# Patient Record
Sex: Male | Born: 1960
Health system: Southern US, Community
[De-identification: ages and names within clinical notes are randomized; demographics above are authoritative.]

## PROBLEM LIST (undated history)

## (undated) DIAGNOSIS — M1990 Unspecified osteoarthritis, unspecified site: Secondary | ICD-10-CM

## (undated) HISTORY — PX: UMBILICAL HERNIA REPAIR: SHX196

---

## 1998-12-02 ENCOUNTER — Encounter: Payer: Self-pay | Admitting: Emergency Medicine

## 1998-12-02 ENCOUNTER — Emergency Department (HOSPITAL_COMMUNITY): Admission: EM | Admit: 1998-12-02 | Discharge: 1998-12-02 | Payer: Self-pay | Admitting: Emergency Medicine

## 2001-07-16 ENCOUNTER — Encounter: Payer: Self-pay | Admitting: Gastroenterology

## 2001-07-16 ENCOUNTER — Encounter: Admission: RE | Admit: 2001-07-16 | Discharge: 2001-07-16 | Payer: Self-pay | Admitting: Gastroenterology

## 2002-08-11 ENCOUNTER — Ambulatory Visit (HOSPITAL_BASED_OUTPATIENT_CLINIC_OR_DEPARTMENT_OTHER): Admission: RE | Admit: 2002-08-11 | Discharge: 2002-08-11 | Payer: Self-pay | Admitting: General Surgery

## 2002-08-11 ENCOUNTER — Encounter (INDEPENDENT_AMBULATORY_CARE_PROVIDER_SITE_OTHER): Payer: Self-pay | Admitting: Specialist

## 2006-11-05 ENCOUNTER — Encounter: Admission: RE | Admit: 2006-11-05 | Discharge: 2006-11-05 | Payer: Self-pay | Admitting: Family Medicine

## 2010-06-03 NOTE — Op Note (Signed)
NAME:  Hector Phillips, Hector Phillips                         ACCOUNT NO.:  [id number]   MEDICAL RECORD NO.:  [id number]                   PATIENT TYPE:  AMB   LOCATION:  DSC                                  FACILITY:  [facility]   PHYSICIAN:  Adolph Pollack, M.D.            DATE OF BIRTH:  December 11, 1960   DATE OF PROCEDURE:  08/11/2002  DATE OF DISCHARGE:                                 OPERATIVE REPORT   PREOPERATIVE DIAGNOSIS:  Soft tissue mass of the right occipital scalp area.   POSTOPERATIVE DIAGNOSIS:  Soft tissue mass of the right occipital scalp  area.   OPERATION/PROCEDURE:  Excision of soft tissue mass from right occipital  scalp region.   ANESTHESIA:  Local, (1% plain lidocaine plus 0.5% plain Marcaine).   INDICATIONS FOR PROCEDURE:  [name] is a 50 year old male who has noticed  a small lump in the back of his scalp and neck area that has been enlarging  here recently.  He denies infection or trauma.  He is now for excision.   TECHNIQUE:  He was brought to the minor procedure room and placed prone on  the table.  The soft tissue mass was marked with a marking pen and the area  was sterilely prepped and draped.  Local anesthetic was infiltrated directly  over the area.  A transverse incision was made directly over the mass and  carried through the subcutaneous tissue and fatty tissue which was fairly  thick.  The mass initially started superficial but grew deep.  There was  some small nerves and blood vessels that I noted that had to be dissected  free from the mass and some of the blood vessels had to be ligated and cut.  I was then able to use blunt dissection to remove the mass which was  lipomatous in nature and measured approximately 2 cm.  The nerve was left  intact.   I examined the wound and at that time, hemostasis appeared adequate.  I  loosely closed some of the subcutaneous tissue with 4-0 Vicryl interrupted  sutures and closed the skin with a running 4-0 Monocryl  subcuticular stitch.  Steri-Strips and sterile dressings were applied.   He tolerated the procedure well without any apparent complications.  He was  sent home in satisfactory condition.  We will see him here in about 10 days  for wound check.  I did tell him that if he had any excessive bleeding or  signs of infection, to call us.  I also recommended that he try Tylenol or  Advil for pain and if that was not sufficient, to call us before 5 o'clock  and we could call him in something a little stronger.  Adolph Pollack, M.D.    Kari Baars  D:  08/11/2002  T:  08/11/2002  Job:  [id number]

## 2013-05-06 ENCOUNTER — Other Ambulatory Visit: Payer: Self-pay | Admitting: Family Medicine

## 2013-05-06 ENCOUNTER — Ambulatory Visit
Admission: RE | Admit: 2013-05-06 | Discharge: 2013-05-06 | Disposition: A | Discharge status: Home / Self Care | Payer: Federal, State, Local not specified - PPO | Source: Ambulatory Visit | Attending: Family Medicine | Admitting: Family Medicine

## 2013-05-06 DIAGNOSIS — M25559 Pain in unspecified hip: Secondary | ICD-10-CM

## 2013-05-06 DIAGNOSIS — M25552 Pain in left hip: Secondary | ICD-10-CM

## 2013-05-23 ENCOUNTER — Ambulatory Visit: Payer: Federal, State, Local not specified - PPO | Attending: Family Medicine | Admitting: Physical Therapy

## 2013-05-23 DIAGNOSIS — M25559 Pain in unspecified hip: Secondary | ICD-10-CM | POA: Insufficient documentation

## 2013-05-23 DIAGNOSIS — IMO0001 Reserved for inherently not codable concepts without codable children: Secondary | ICD-10-CM | POA: Insufficient documentation

## 2013-05-27 ENCOUNTER — Ambulatory Visit: Payer: Federal, State, Local not specified - PPO | Admitting: Physical Therapy

## 2013-05-28 ENCOUNTER — Ambulatory Visit: Payer: Federal, State, Local not specified - PPO | Admitting: Physical Therapy

## 2013-06-02 ENCOUNTER — Ambulatory Visit: Payer: Federal, State, Local not specified - PPO | Admitting: Physical Therapy

## 2013-06-04 ENCOUNTER — Ambulatory Visit: Payer: Federal, State, Local not specified - PPO | Admitting: Physical Therapy

## 2013-06-06 ENCOUNTER — Ambulatory Visit: Payer: Federal, State, Local not specified - PPO | Admitting: Physical Therapy

## 2013-06-10 ENCOUNTER — Ambulatory Visit: Payer: Federal, State, Local not specified - PPO | Admitting: Physical Therapy

## 2013-06-11 ENCOUNTER — Ambulatory Visit: Payer: Federal, State, Local not specified - PPO | Admitting: Physical Therapy

## 2013-06-13 ENCOUNTER — Ambulatory Visit: Payer: Federal, State, Local not specified - PPO | Admitting: Physical Therapy

## 2013-06-16 ENCOUNTER — Ambulatory Visit: Payer: Federal, State, Local not specified - PPO | Attending: Family Medicine | Admitting: Physical Therapy

## 2013-06-16 DIAGNOSIS — IMO0001 Reserved for inherently not codable concepts without codable children: Secondary | ICD-10-CM | POA: Insufficient documentation

## 2013-06-16 DIAGNOSIS — M25559 Pain in unspecified hip: Secondary | ICD-10-CM | POA: Insufficient documentation

## 2013-06-18 ENCOUNTER — Ambulatory Visit: Payer: Federal, State, Local not specified - PPO | Admitting: Physical Therapy

## 2013-06-20 ENCOUNTER — Ambulatory Visit: Payer: Federal, State, Local not specified - PPO | Admitting: Physical Therapy

## 2013-06-23 ENCOUNTER — Encounter: Payer: Federal, State, Local not specified - PPO | Admitting: Physical Therapy

## 2013-06-25 ENCOUNTER — Encounter: Payer: Federal, State, Local not specified - PPO | Admitting: Physical Therapy

## 2013-06-27 ENCOUNTER — Encounter: Payer: Federal, State, Local not specified - PPO | Admitting: Physical Therapy

## 2013-06-30 ENCOUNTER — Ambulatory Visit: Payer: Federal, State, Local not specified - PPO | Admitting: Physical Therapy

## 2013-07-02 ENCOUNTER — Encounter: Payer: Federal, State, Local not specified - PPO | Admitting: Physical Therapy

## 2013-07-04 ENCOUNTER — Encounter: Payer: Federal, State, Local not specified - PPO | Admitting: Physical Therapy

## 2015-01-12 DIAGNOSIS — J329 Chronic sinusitis, unspecified: Secondary | ICD-10-CM

## 2015-01-12 HISTORY — DX: Chronic sinusitis, unspecified: J32.9

## 2015-01-29 DIAGNOSIS — J309 Allergic rhinitis, unspecified: Secondary | ICD-10-CM

## 2015-01-29 HISTORY — DX: Allergic rhinitis, unspecified: J30.9

## 2015-04-22 DIAGNOSIS — M25561 Pain in right knee: Secondary | ICD-10-CM | POA: Diagnosis not present

## 2015-04-26 DIAGNOSIS — M1711 Unilateral primary osteoarthritis, right knee: Secondary | ICD-10-CM | POA: Diagnosis not present

## 2015-05-03 DIAGNOSIS — K080 Exfoliation of teeth due to systemic causes: Secondary | ICD-10-CM | POA: Diagnosis not present

## 2015-09-01 DIAGNOSIS — L299 Pruritus, unspecified: Secondary | ICD-10-CM | POA: Diagnosis not present

## 2015-11-04 DIAGNOSIS — K080 Exfoliation of teeth due to systemic causes: Secondary | ICD-10-CM | POA: Diagnosis not present

## 2015-12-07 DIAGNOSIS — M67911 Unspecified disorder of synovium and tendon, right shoulder: Secondary | ICD-10-CM | POA: Diagnosis not present

## 2016-01-05 ENCOUNTER — Ambulatory Visit (INDEPENDENT_AMBULATORY_CARE_PROVIDER_SITE_OTHER): Payer: Federal, State, Local not specified - PPO | Admitting: Orthopedic Surgery

## 2016-01-05 ENCOUNTER — Encounter (INDEPENDENT_AMBULATORY_CARE_PROVIDER_SITE_OTHER): Payer: Self-pay | Admitting: Orthopedic Surgery

## 2016-01-05 ENCOUNTER — Encounter (INDEPENDENT_AMBULATORY_CARE_PROVIDER_SITE_OTHER): Payer: Self-pay

## 2016-01-05 ENCOUNTER — Ambulatory Visit (INDEPENDENT_AMBULATORY_CARE_PROVIDER_SITE_OTHER): Payer: Self-pay

## 2016-01-05 DIAGNOSIS — M25511 Pain in right shoulder: Secondary | ICD-10-CM | POA: Diagnosis not present

## 2016-01-05 DIAGNOSIS — G8929 Other chronic pain: Secondary | ICD-10-CM | POA: Diagnosis not present

## 2016-01-05 NOTE — Progress Notes (Signed)
Office Visit Note   Patient: Hector Phillips           Date of Birth: 05/10/1960           MRN: [id number] Visit Date: 01/05/2016 Requested by: Mila PalmerSharon Wolters, MD [address] [place], [place] [address] PCP: Emeterio ReeveWOLTERS,SHARON A, MD  Subjective: Chief Complaint  Patient presents with  . Right Shoulder - Pain    HPI Hector FallenKenneth is a 55 year old For the [employer] he describes several month history of right shoulder pain.  He actually injured it years ago and has had recurrent severe pain when he threw a football the summer.  He's been having a lot of superior anterior pain since that time.  Denies any neck pain or numbness and tingling radiating into the arms.  He does describe decreased strength in the right arm.  No definite mechanical symptoms.  He did try a course of an anti-inflammatory and some home exercises which did not help.  He currently not taking medicines because the previous anti-inflammatories do not help him.  When he does fast motions such is 40 flexion or extension it is.  Painful for him.  He has a constant grade 1-2 out of 10 pain in the shoulder.  He is not a smoker.  Plans on retiring from the [employer] in 7 years.  t              Review of Systems All systems reviewed are negative as they relate to the chief complaint within the history of present illness.  Patient denies  fevers or chills.    Assessment & Plan: Visit Diagnoses:  1. Chronic right shoulder pain     Plan: Impression is right shoulder pain unclear etiology.  On exam he has pretty reasonable strength and not much course grinding so if he has a rotator cuff tear is likely small.  The other possibility is that this represents a bursitis or acromioclavicular arthritis (this is not particular symptomatic on exam).  This could also represent a superior labral pathology.  Referred pain from the neck less likely.  Plan is MRI arthrogram of the right shoulder.  I'll see him back after that  study  Follow-Up Instructions: No Follow-up on file.   Orders:  Orders Placed This Encounter  Procedures  . XR Shoulder Right   No orders of the defined types were placed in this encounter.     Procedures: No procedures performed   Clinical Data: No additional findings.  Objective: Vital Signs: There were no vitals taken for this visit.  Physical Exam All systems reviewed are negative as they relate to the chief complaint within the history of present illness.  Patient denies  fevers or chills.   Ortho Exam examination demonstrates good cervical spine range of motion 5 out of 5 grip EPL FPL interosseous wrist flexion and wrist extension biceps triceps and deltoid strength.  No definite paresthesias C5 T1.  Radial pulses intact bilaterally.  Both shoulders have good range of motion passively with no restriction of external rotation at 15 of abduction.  Rotator cuff strength is good to isolated infraspinatus supraspinous subscap muscle testing.  Impingement signs positive on the right negative on the left.  O'Brien's testing negative on the right these testing positive on the right there is no real asymmetric acromioclavicular joint tenderness on exam  Specialty Comments:  No specialty comments available.  Imaging: Xr Shoulder Right  Result Date: 01/05/2016 AP outlet and axillary view right  shoulder reviewed.  Joint is reduced.  There is no evidence of glenohumeral arthritis.  There is spurring and hypertrophy of the distal end of the clavicle.  No other soft tissue calcifications present.  Type II acromion present.  Visualized lung fields are clear.    PMFS History: There are no active problems to display for this patient.  No past medical history on file.  Family History  Problem Relation Age of Onset  . Colon cancer Other     No past surgical history on file. Social History   Occupational History  . Not on file.   Social History Main Topics  . Smoking status:  Never Smoker  . Smokeless tobacco: Never Used  . Alcohol use No  . Drug use: Unknown  . Sexual activity: Not on file

## 2016-01-19 ENCOUNTER — Ambulatory Visit
Admission: RE | Admit: 2016-01-19 | Discharge: 2016-01-19 | Disposition: A | Discharge status: Home / Self Care | Payer: Federal, State, Local not specified - PPO | Source: Ambulatory Visit | Attending: Orthopedic Surgery | Admitting: Orthopedic Surgery

## 2016-01-19 DIAGNOSIS — M25511 Pain in right shoulder: Secondary | ICD-10-CM | POA: Diagnosis not present

## 2016-01-19 DIAGNOSIS — M7581 Other shoulder lesions, right shoulder: Secondary | ICD-10-CM | POA: Diagnosis not present

## 2016-01-19 DIAGNOSIS — G8929 Other chronic pain: Secondary | ICD-10-CM

## 2016-01-19 MED ORDER — IOPAMIDOL (ISOVUE-M 200) INJECTION 41%
15.0000 mL | Freq: Once | INTRAMUSCULAR | Status: AC
  Administered 2016-01-19: 15 mL via INTRA_ARTICULAR

## 2016-02-02 ENCOUNTER — Ambulatory Visit (INDEPENDENT_AMBULATORY_CARE_PROVIDER_SITE_OTHER): Payer: Federal, State, Local not specified - PPO | Admitting: Orthopedic Surgery

## 2016-02-10 ENCOUNTER — Encounter (INDEPENDENT_AMBULATORY_CARE_PROVIDER_SITE_OTHER): Payer: Self-pay | Admitting: Orthopedic Surgery

## 2016-02-10 ENCOUNTER — Encounter (INDEPENDENT_AMBULATORY_CARE_PROVIDER_SITE_OTHER): Payer: Self-pay

## 2016-02-10 ENCOUNTER — Ambulatory Visit (INDEPENDENT_AMBULATORY_CARE_PROVIDER_SITE_OTHER): Payer: Federal, State, Local not specified - PPO | Admitting: Orthopedic Surgery

## 2016-02-10 DIAGNOSIS — M75111 Incomplete rotator cuff tear or rupture of right shoulder, not specified as traumatic: Secondary | ICD-10-CM

## 2016-02-10 NOTE — Progress Notes (Signed)
   Office Visit Note   Patient: Hector Phillips           Date of Birth: 04/23/1960           MRN: [id number] Visit Date: 02/10/2016 Requested by: Mila PalmerSharon Wolters, MD [address] [place], [place] [address] PCP: Emeterio ReeveWOLTERS,SHARON A, MD  Subjective: Chief Complaint  Patient presents with  . Right Shoulder - Pain, Follow-up    HPI Hector FallenKenneth is a patient with right shoulder pain.  Since of [place] she's had an MRI scan which is reviewed today.  They show very small supraspinatus tear which does appear full-thickness.  Minimal to no retraction.  Labrum looks reasonable and before meals joint has only minimal degenerative changes.   He still taking any medication for the problem.  He has a lot of [employer] training things to do in the spring and summer.  Reaching behind him is what is most painful for him.  He's not really localizing much his pain to the acromioclavicular joint              Review of Systems All systems reviewed are negative as they relate to the chief complaint within the history of present illness.  Patient denies  fevers or chills.    Assessment & Plan: Visit Diagnoses:  1. Partial nontraumatic rupture of right rotator cuff     Plan: Impression is right shoulder small rotator cuff tear which does explain his symptoms pretty nicely.  Exam today is pretty unremarkable except for some pain with extension.  Plan is rotator cuff tear repair.  Some of this training may give him some somatic issues but I doubt that it would make the tear bigger.  We will see him back in July and likely schedule him for surgery in September at that time  Follow-Up Instructions: No Follow-up on file.   Orders:  No orders of the defined types were placed in this encounter.  No orders of the defined types were placed in this encounter.     Procedures: No procedures performed   Clinical Data: No additional findings.  Objective: Vital Signs: There were no vitals taken for this  visit.  Physical Exam   Constitutional: Patient appears well-developed HEENT:  Head: Normocephalic Eyes:EOM are normal Neck: Normal range of motion Cardiovascular: Normal rate Pulmonary/chest: Effort normal Neurologic: Patient is alert Skin: Skin is warm Psychiatric: Patient has normal mood and affect    Ortho Exam examination of the right shoulder demonstrates full active and passive range of motion with pretty reasonable strength.X-rays subscap muscle testing.  No discrete before meals joint tenderness to direct palpation right versus left nor is there significant increase in pain with crossarm adduction.  Negative apprehension relocation testing.  Specialty Comments:  No specialty comments available.  Imaging: No results found.   PMFS History: Patient Active Problem List   Diagnosis Date Noted  . Partial nontraumatic rupture of right rotator cuff 02/10/2016   No past medical history on file.  Family History  Problem Relation Age of Onset  . Colon cancer Other     No past surgical history on file. Social History   Occupational History  . Not on file.   Social History Main Topics  . Smoking status: Never Smoker  . Smokeless tobacco: Never Used  . Alcohol use No  . Drug use: Unknown  . Sexual activity: Not on file

## 2016-05-08 DIAGNOSIS — K080 Exfoliation of teeth due to systemic causes: Secondary | ICD-10-CM | POA: Diagnosis not present

## 2016-08-09 ENCOUNTER — Ambulatory Visit (INDEPENDENT_AMBULATORY_CARE_PROVIDER_SITE_OTHER): Payer: Federal, State, Local not specified - PPO | Admitting: Orthopedic Surgery

## 2016-09-22 DIAGNOSIS — K429 Umbilical hernia without obstruction or gangrene: Secondary | ICD-10-CM | POA: Diagnosis not present

## 2016-10-02 ENCOUNTER — Ambulatory Visit (INDEPENDENT_AMBULATORY_CARE_PROVIDER_SITE_OTHER): Payer: Federal, State, Local not specified - PPO | Admitting: Orthopedic Surgery

## 2016-10-02 ENCOUNTER — Encounter (INDEPENDENT_AMBULATORY_CARE_PROVIDER_SITE_OTHER): Payer: Self-pay | Admitting: Orthopedic Surgery

## 2016-10-02 DIAGNOSIS — M75111 Incomplete rotator cuff tear or rupture of right shoulder, not specified as traumatic: Secondary | ICD-10-CM

## 2016-10-05 NOTE — Progress Notes (Signed)
   Office Visit Note   Patient: Hector Phillips           Date of Birth: May 15, 1960           MRN: [id number] Visit Date: 10/02/2016 Requested by: Mila Palmer, MD [address] [place], [place] [address] PCP: Mila Palmer, MD  Subjective: Chief Complaint  Patient presents with  . Right Shoulder - Follow-up    HPI: Hector Phillips is a patient who is in the [employer].  He has right shoulder pain.  He has a nontraumatic small rotator cuff tear.  He was supposed to follow-up in July to be scheduled for surgery but he has been doing well.  He states that his shoulder and arm is getting better.  He is not reporting any mechanical symptoms in the shoulder at this time              ROS: All systems reviewed are negative as they relate to the chief complaint within the history of present illness.  Patient denies  fevers or chills.   Assessment & Plan: Visit Diagnoses:  1. Partial nontraumatic rupture of right rotator cuff     Plan: Impression is right shoulder small nontraumatic rupture of the rotator cuff.  He is doing well currently.  No real limitations.  Range of motion has normalized.  Plan is to wait this out six-month return clinical recheck at that time.  Follow-Up Instructions: Return in about 6 months (around 04/01/2017).   Orders:  No orders of the defined types were placed in this encounter.  No orders of the defined types were placed in this encounter.     Procedures: No procedures performed   Clinical Data: No additional findings.  Objective: Vital Signs: There were no vitals taken for this visit.  Physical Exam:   Constitutional: Patient appears well-developed HEENT:  Head: Normocephalic Eyes:EOM are normal Neck: Normal range of motion Cardiovascular: Normal rate Pulmonary/chest: Effort normal Neurologic: Patient is alert Skin: Skin is warm Psychiatric: Patient has normal mood and affect    Ortho Exam: Orthopedic exam demonstrates full  active and passive range of motion of the right and left shoulder.  I will detect any coarseness or grinding with internal/external rotation of the right hand shoulder.  Motor sensory function to the hand is intact bilaterally cervical spine range of motion is normal.  On the right there is negative apprehension relocation testing and no improvement clavicular joint tenderness  Specialty Comments:  No specialty comments available.  Imaging: No results found.   PMFS History: Patient Active Problem List   Diagnosis Date Noted  . Partial nontraumatic rupture of right rotator cuff 02/10/2016   No past medical history on file.  Family History  Problem Relation Age of Onset  . Colon cancer Other     No past surgical history on file. Social History   Occupational History  . Not on file.   Social History Main Topics  . Smoking status: Never Smoker  . Smokeless tobacco: Never Used  . Alcohol use No  . Drug use: Unknown  . Sexual activity: Not on file

## 2016-10-31 DIAGNOSIS — K080 Exfoliation of teeth due to systemic causes: Secondary | ICD-10-CM | POA: Diagnosis not present

## 2016-10-31 DIAGNOSIS — K439 Ventral hernia without obstruction or gangrene: Secondary | ICD-10-CM | POA: Diagnosis not present

## 2016-11-24 DIAGNOSIS — K429 Umbilical hernia without obstruction or gangrene: Secondary | ICD-10-CM | POA: Diagnosis not present

## 2016-11-24 DIAGNOSIS — K439 Ventral hernia without obstruction or gangrene: Secondary | ICD-10-CM | POA: Diagnosis not present

## 2017-02-12 DIAGNOSIS — R05 Cough: Secondary | ICD-10-CM | POA: Diagnosis not present

## 2017-03-08 DIAGNOSIS — Z0000 Encounter for general adult medical examination without abnormal findings: Secondary | ICD-10-CM | POA: Diagnosis not present

## 2017-04-11 ENCOUNTER — Ambulatory Visit (INDEPENDENT_AMBULATORY_CARE_PROVIDER_SITE_OTHER): Payer: Federal, State, Local not specified - PPO

## 2017-04-11 ENCOUNTER — Ambulatory Visit (INDEPENDENT_AMBULATORY_CARE_PROVIDER_SITE_OTHER): Payer: Federal, State, Local not specified - PPO | Admitting: Orthopedic Surgery

## 2017-04-11 ENCOUNTER — Encounter (INDEPENDENT_AMBULATORY_CARE_PROVIDER_SITE_OTHER): Payer: Self-pay | Admitting: Orthopedic Surgery

## 2017-04-11 DIAGNOSIS — G8929 Other chronic pain: Secondary | ICD-10-CM

## 2017-04-11 DIAGNOSIS — M25561 Pain in right knee: Secondary | ICD-10-CM

## 2017-04-11 NOTE — Progress Notes (Signed)
Office Visit Note   Patient: Hector Phillips           Date of Birth: 07/24/1960           MRN: [id number] Visit Date: 04/11/2017 Requested by: Mila PalmerWolters, Sharon, MD [address] [place], [place] [address] PCP: Mila PalmerWolters, Sharon, MD  Subjective: Chief Complaint  Patient presents with  . Right Shoulder - Pain, Follow-up  . Right Knee - Pain    HPI: Hector FallenKenneth is a patient who presents for reevaluation of his right shoulder.  Had small partial-thickness rotator cuff tearing evaluated 6 months ago.  He is actually taken some time off and done very well with the right shoulder.  Currently he can do push-ups and has no real shoulder issues.  He does report some significant right knee pain.  Both knees hurt him when he runs.  Has a history of patellar dislocation x1 on both knees.  Does not report any mechanical symptoms but does report pain going up and down stairs as well as with prolonged running.  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              ROS: All systems reviewed are negative as they relate to the chief complaint within the history of present illness.  Patient denies  fevers or chills.   Assessment & Plan: Visit Diagnoses:  1. Chronic pain of right knee     Plan: Impression is patellofemoral arthritis and wear in both knees right worse than left.  Plan is to avoid running which is a very provocative activity for him.  I filled out a [employer] form that states he should not really run and should do walking for his aerobic exercise check off.  His shoulder is doing pretty well.  No real restrictions regarding the shoulder.  I will see him back as needed.  Follow-Up Instructions: No follow-ups on file.   Orders:  Orders Placed This Encounter  Procedures  . XR KNEE 3 VIEW RIGHT   No orders of the defined types were placed in this encounter.     Procedures: No procedures performed   Clinical Data: No additional findings.  Objective: Vital  Signs: There were no vitals taken for this visit.  Physical Exam:   Constitutional: Patient appears well-developed HEENT:  Head: Normocephalic Eyes:EOM are normal Neck: Normal range of motion Cardiovascular: Normal rate Pulmonary/chest: Effort normal Neurologic: Patient is alert Skin: Skin is warm Psychiatric: Patient has normal mood and affect    Ortho Exam: Orthopedic exam demonstrates full active and passive range of motion of both shoulders with good rotator cuff strength to infraspinatus supraspinatus and subscap testing on the right left-hand side.  No masses lymph adenopathy or skin changes noted in the shoulder girdle region.  Motor sensory function to the hand is intact.  Both knees are examined.  Patellofemoral crepitus is present.  Negative patellar apprehension bilaterally with no effusion.  Collateral cruciate ligaments are stable.  No other masses lymph adenopathy or skin changes noted in the bilateral knee region.  Range of motion is full.  Specialty Comments:  No specialty comments available.  Imaging: No results found.   PMFS History: Patient Active Problem List   Diagnosis Date Noted  . Partial nontraumatic rupture of right rotator cuff 02/10/2016   History reviewed. No pertinent past medical history.  Family History  Problem Relation Age of Onset  . Colon cancer Other     History reviewed. No  pertinent surgical history. Social History   Occupational History  . Not on file  Tobacco Use  . Smoking status: Never Smoker  . Smokeless tobacco: Never Used  Substance and Sexual Activity  . Alcohol use: No  . Drug use: Not on file  . Sexual activity: Not on file

## 2017-04-16 ENCOUNTER — Telehealth (INDEPENDENT_AMBULATORY_CARE_PROVIDER_SITE_OTHER): Payer: Self-pay | Admitting: Orthopedic Surgery

## 2017-04-16 NOTE — Telephone Encounter (Signed)
Patient [facility] Dr[facility] August Sauce[facility]an [facility] which he wanted to send a copy to show you what this said so I will put it in Dr. Alfonso Patteneans box. He was informed that he needs a note with the letterhead from the office. In this letter it needs to explain his diagnosis and what his limitations are and if they are permanent. Dr. Alfonso Patteneans signature also needs to be on it. Please email patient once complete at   [email or web address]   If questions patient cb # [phone]

## 2017-04-17 NOTE — Telephone Encounter (Signed)
Can discuss with Dr August Saucerean when he returns to clinic tomorrow.

## 2017-04-18 NOTE — Telephone Encounter (Signed)
Patient called to check on status of letter that he needs.

## 2017-04-18 NOTE — Telephone Encounter (Signed)
I spoke with patient and advised per Dr August Saucerean

## 2017-04-18 NOTE — Telephone Encounter (Signed)
Please review and advise.

## 2017-04-18 NOTE — Telephone Encounter (Signed)
Tried calling patient. No answer. LMVM for him advising that Dr August Saucerean was in surgery all day yesterday and as soon as he gets a chance to look at this I will let him know.

## 2017-04-18 NOTE — Telephone Encounter (Signed)
I signed the form and you can send a note but in terms of accessory dictation I think everything that is required is in the clinic note

## 2017-05-02 DIAGNOSIS — Z125 Encounter for screening for malignant neoplasm of prostate: Secondary | ICD-10-CM | POA: Diagnosis not present

## 2017-05-02 DIAGNOSIS — K080 Exfoliation of teeth due to systemic causes: Secondary | ICD-10-CM | POA: Diagnosis not present

## 2017-05-02 DIAGNOSIS — K219 Gastro-esophageal reflux disease without esophagitis: Secondary | ICD-10-CM | POA: Diagnosis not present

## 2017-05-14 DIAGNOSIS — I10 Essential (primary) hypertension: Secondary | ICD-10-CM

## 2017-05-14 HISTORY — DX: Essential (primary) hypertension: I10

## 2017-05-15 ENCOUNTER — Telehealth (INDEPENDENT_AMBULATORY_CARE_PROVIDER_SITE_OTHER): Payer: Self-pay | Admitting: Orthopedic Surgery

## 2017-05-15 NOTE — Telephone Encounter (Signed)
Thanks

## 2017-05-15 NOTE — Telephone Encounter (Signed)
Patient needs copy of Xrays on a CD. He would like to pickup on May 14th. Callback [phone]

## 2017-05-15 NOTE — Telephone Encounter (Signed)
I have put images on disc that were done at our facility only. There are other exams that patient has had done previously at other facilities, and he will reach out to them for those images.

## 2017-05-29 DIAGNOSIS — I10 Essential (primary) hypertension: Secondary | ICD-10-CM | POA: Diagnosis not present

## 2017-06-01 ENCOUNTER — Telehealth (INDEPENDENT_AMBULATORY_CARE_PROVIDER_SITE_OTHER): Payer: Self-pay | Admitting: Orthopedic Surgery

## 2017-06-01 NOTE — Telephone Encounter (Signed)
I called and he is trying to bring them up on his personal home computer, I did advise that it may require that he have a medical program on the computer to be able to view the disc.  I did print out hard copies of the xrays for him. He states that his son Hector Phillips would be able to pick those up on Tuesday or Wednesday for him. Hector Phillips is not on the DPR form, I di advise that we would do a 1 time verbal auth for him to be able to pick those up.  He agreed.

## 2017-06-01 NOTE — Telephone Encounter (Signed)
Please call.

## 2017-06-01 NOTE — Telephone Encounter (Signed)
Patient called stating that the CD with his xrays on it isn't working when it's been played. If you could give him a call back at [phone]

## 2017-06-07 ENCOUNTER — Telehealth (INDEPENDENT_AMBULATORY_CARE_PROVIDER_SITE_OTHER): Payer: Self-pay | Admitting: Orthopedic Surgery

## 2017-06-07 NOTE — Telephone Encounter (Signed)
Patient LMVM wanting to pickup xray reports tomorrow morning. He will sign release form when he comes to pickup

## 2017-10-02 DIAGNOSIS — K080 Exfoliation of teeth due to systemic causes: Secondary | ICD-10-CM | POA: Diagnosis not present

## 2017-11-17 IMAGING — XA DG FLUORO GUIDE NDL PLC/BX
2 series · 2 of 2 positions shown · IV contrast (multihance)
Comparison: none

CLINICAL DATA: Chronic right shoulder pain.

EXAM:
RIGHT SHOULDER INJECTION UNDER FLUOROSCOPY
TECHNIQUE: An appropriate skin entrance site was determined. The site was
marked, prepped with Betadine, draped in the usual sterile fashion,
and infiltrated locally with buffered Lidocaine. 22 gauge spinal
needle was advanced to the superomedial margin of the humeral head
under intermittent fluoroscopy. 1 ml of Lidocaine injected easily. A
mixture of 0.05 mL of MultiHance, 5 mL of 1% lidocaine, and 15 mL of
Isovue-M 200 was then used to opacify the right shoulder capsule. 13
mL of this mixture were injected. No immediate complication.
FLUOROSCOPY TIME:  Fluoroscopy Time:  3 seconds
Radiation Exposure Index (if provided by the fluoroscopic device):
14.26 microGray*m^2
Number of Acquired Spot Images: 0

[Series 1: ortho standard · 1 of 1 slices shown (1 of 2)]
[im 1/1]
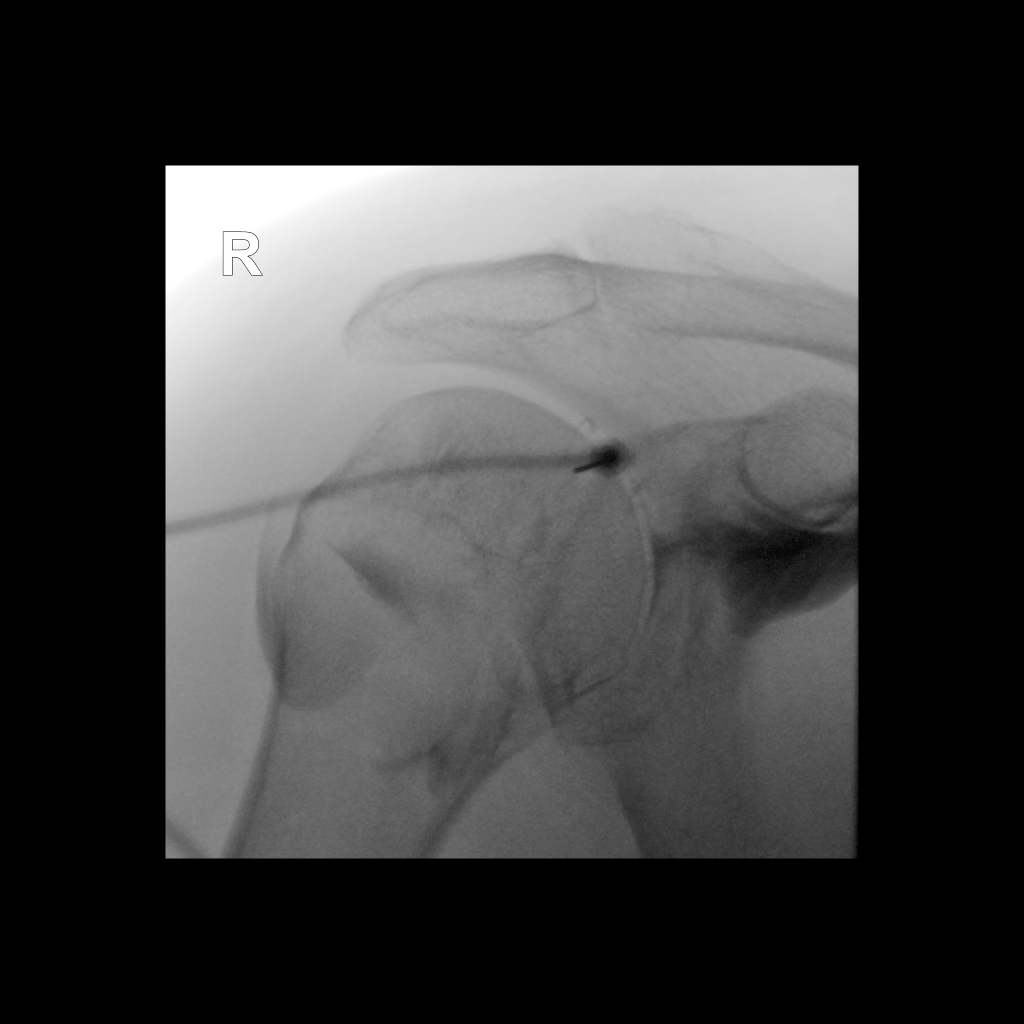

[Series 2: ortho standard · 1 of 1 slices shown (2 of 2)]
[im 1/1]
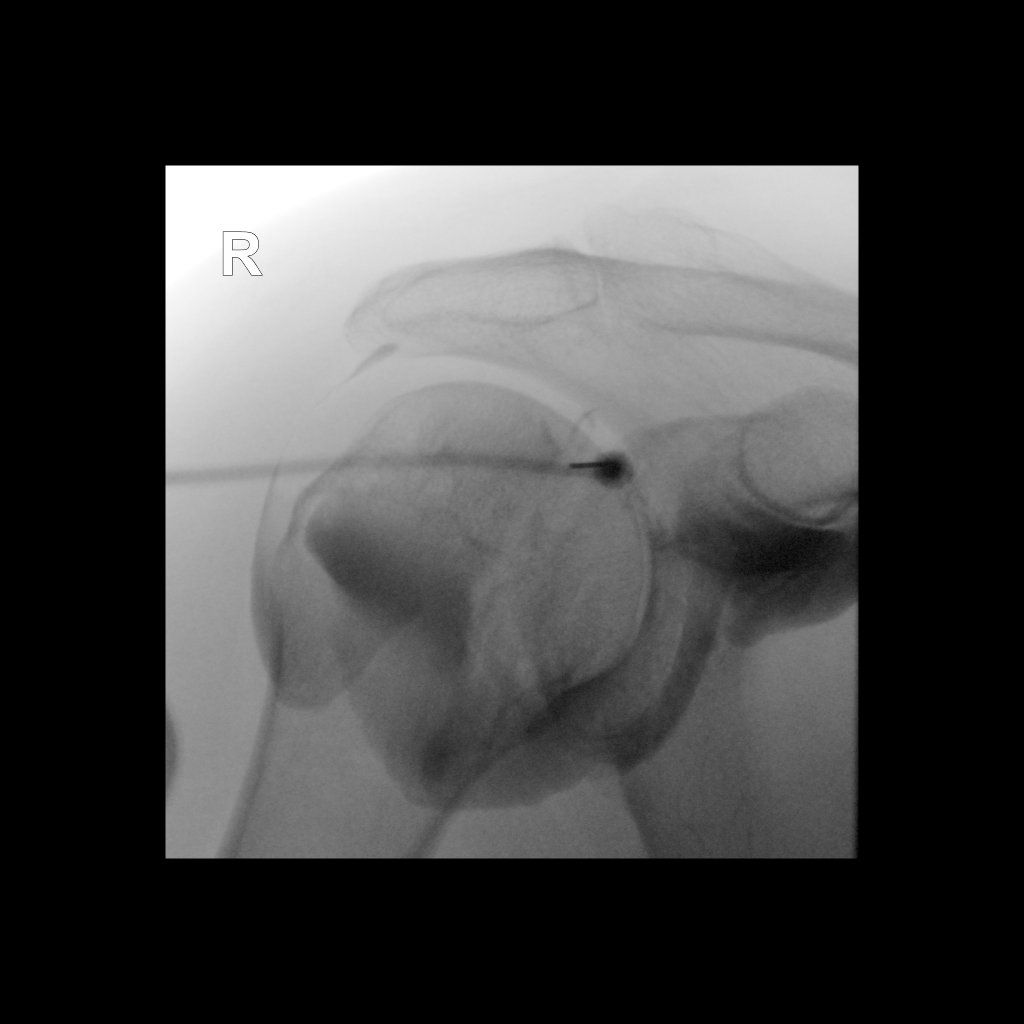

[2 of 2 positions shown; findings below may reference images not displayed]

IMPRESSION: Technically successful right shoulder injection for MRI.

## 2019-04-03 DIAGNOSIS — Z1159 Encounter for screening for other viral diseases: Secondary | ICD-10-CM | POA: Diagnosis not present

## 2019-04-08 DIAGNOSIS — Z800 Family history of malignant neoplasm of digestive organs: Secondary | ICD-10-CM | POA: Diagnosis not present

## 2019-04-08 DIAGNOSIS — K5730 Diverticulosis of large intestine without perforation or abscess without bleeding: Secondary | ICD-10-CM | POA: Diagnosis not present

## 2019-05-06 DIAGNOSIS — K219 Gastro-esophageal reflux disease without esophagitis: Secondary | ICD-10-CM

## 2019-05-06 DIAGNOSIS — I10 Essential (primary) hypertension: Secondary | ICD-10-CM | POA: Diagnosis not present

## 2019-05-06 HISTORY — DX: Gastro-esophageal reflux disease without esophagitis: K21.9

## 2019-11-07 DIAGNOSIS — Z0000 Encounter for general adult medical examination without abnormal findings: Secondary | ICD-10-CM | POA: Diagnosis not present

## 2020-11-19 DIAGNOSIS — I10 Essential (primary) hypertension: Secondary | ICD-10-CM | POA: Diagnosis not present

## 2020-11-19 DIAGNOSIS — Z23 Encounter for immunization: Secondary | ICD-10-CM | POA: Diagnosis not present

## 2020-11-19 DIAGNOSIS — Z125 Encounter for screening for malignant neoplasm of prostate: Secondary | ICD-10-CM | POA: Diagnosis not present

## 2020-11-19 DIAGNOSIS — Z5181 Encounter for therapeutic drug level monitoring: Secondary | ICD-10-CM | POA: Diagnosis not present

## 2021-01-21 DIAGNOSIS — Z23 Encounter for immunization: Secondary | ICD-10-CM | POA: Diagnosis not present

## 2021-03-07 DIAGNOSIS — Z03818 Encounter for observation for suspected exposure to other biological agents ruled out: Secondary | ICD-10-CM | POA: Diagnosis not present

## 2021-03-07 DIAGNOSIS — Z20822 Contact with and (suspected) exposure to covid-19: Secondary | ICD-10-CM | POA: Diagnosis not present

## 2021-12-15 DIAGNOSIS — Z0000 Encounter for general adult medical examination without abnormal findings: Secondary | ICD-10-CM | POA: Diagnosis not present

## 2021-12-15 DIAGNOSIS — I10 Essential (primary) hypertension: Secondary | ICD-10-CM | POA: Diagnosis not present

## 2021-12-15 DIAGNOSIS — Z79899 Other long term (current) drug therapy: Secondary | ICD-10-CM | POA: Diagnosis not present

## 2021-12-15 DIAGNOSIS — R0600 Dyspnea, unspecified: Secondary | ICD-10-CM | POA: Diagnosis not present

## 2021-12-15 DIAGNOSIS — Z13220 Encounter for screening for lipoid disorders: Secondary | ICD-10-CM | POA: Diagnosis not present

## 2021-12-15 DIAGNOSIS — Z125 Encounter for screening for malignant neoplasm of prostate: Secondary | ICD-10-CM | POA: Diagnosis not present

## 2021-12-15 DIAGNOSIS — R0609 Other forms of dyspnea: Secondary | ICD-10-CM

## 2021-12-15 HISTORY — DX: Other forms of dyspnea: R06.09

## 2022-02-16 ENCOUNTER — Ambulatory Visit: Payer: Federal, State, Local not specified - PPO | Attending: Cardiovascular Disease

## 2022-02-16 ENCOUNTER — Other Ambulatory Visit: Payer: Self-pay | Admitting: Family Medicine

## 2022-02-16 DIAGNOSIS — R0609 Other forms of dyspnea: Secondary | ICD-10-CM | POA: Diagnosis not present

## 2022-02-16 LAB — EXERCISE TOLERANCE TEST
Angina Index: 0
Duke Treadmill Score: 9
Estimated workload: 10.1
Exercise duration (min): 9 min
Exercise duration (sec): 0 s
MPHR: 161 {beats}/min
Peak HR: 162 {beats}/min
Percent HR: 100 %
RPE: 15
Rest HR: 83 {beats}/min
ST Depression (mm): 0 mm

## 2022-02-28 ENCOUNTER — Telehealth: Payer: Self-pay | Admitting: *Deleted

## 2022-02-28 NOTE — Telephone Encounter (Signed)
error 

## 2022-03-27 DIAGNOSIS — L821 Other seborrheic keratosis: Secondary | ICD-10-CM | POA: Diagnosis not present

## 2022-03-27 DIAGNOSIS — L578 Other skin changes due to chronic exposure to nonionizing radiation: Secondary | ICD-10-CM | POA: Diagnosis not present

## 2022-03-27 DIAGNOSIS — L570 Actinic keratosis: Secondary | ICD-10-CM | POA: Diagnosis not present

## 2022-03-27 DIAGNOSIS — D1801 Hemangioma of skin and subcutaneous tissue: Secondary | ICD-10-CM | POA: Diagnosis not present

## 2022-03-27 DIAGNOSIS — L814 Other melanin hyperpigmentation: Secondary | ICD-10-CM | POA: Diagnosis not present

## 2022-07-28 DIAGNOSIS — R0600 Dyspnea, unspecified: Secondary | ICD-10-CM | POA: Diagnosis not present

## 2022-09-13 DIAGNOSIS — K649 Unspecified hemorrhoids: Secondary | ICD-10-CM | POA: Diagnosis not present

## 2022-09-29 NOTE — Progress Notes (Signed)
Hector Else, MD Reason for referral-dyspnea on exertion  HPI: 62 year old male for evaluation of dyspnea on exertion at request of Mila Palmer, MD.  Exercise treadmill February 2024-duration 9 minutes, no chest pain, no ST changes.  Patient states that since 2019 he has had worsening dyspnea on exertion.  This occurs with more vigorous activities but not routine activities in the house.  He is unsure if this is related to deconditioning.  There is no associated chest pain.  There is no orthopnea, PND or pedal edema.  No syncope.  Cardiology now asked to evaluate.  Current Outpatient Medications  Medication Sig Dispense Refill   lisinopril (PRINIVIL,ZESTRIL) 10 MG tablet Take 10 mg by mouth daily.  12   omeprazole (PRILOSEC) 20 MG capsule Take 20 mg by mouth daily.     omeprazole (PRILOSEC) 20 MG capsule TAKE 20 MG BY MOUTH 2 TIMES DAILY BEFORE MEALS FOR 30 DAYS. (Patient not taking: Reported on 10/10/2022)     No current facility-administered medications for this visit.    No Known Allergies   Past Medical History:  Diagnosis Date   Allergic rhinitis 01/29/2015   DOE (dyspnea on exertion) 12/15/2021   Essential hypertension 05/14/2017   GERD (gastroesophageal reflux disease) 05/06/2019   Sinusitis 01/12/2015    Past Surgical History:  Procedure Laterality Date   UMBILICAL HERNIA REPAIR      Social History   Socioeconomic History   Marital status: Married    Spouse name: Not on file   Number of children: 1   Years of education: Not on file   Highest education level: Not on file  Occupational History    Employer: [employer]  Tobacco Use   Smoking status: Never   Smokeless tobacco: Never  Vaping Use   Vaping status: Never Used  Substance and Sexual Activity   Alcohol use: No   Drug use: Never   Sexual activity: Yes  Other Topics Concern   Not on file  Social History Narrative   Not on file   Social Determinants of Health   Financial  Resource Strain: Not on file  Food Insecurity: Not on file  Transportation Needs: Not on file  Physical Activity: Not on file  Stress: Not on file  Social Connections: Not on file  Intimate Partner Violence: Not on file    Family History  Problem Relation Age of Onset   Cancer Father    Colon cancer Other     ROS: no fevers or chills, productive cough, hemoptysis, dysphasia, odynophagia, melena, hematochezia, dysuria, hematuria, rash, seizure activity, orthopnea, PND, pedal edema, claudication. Remaining systems are negative.  Physical Exam:   Blood pressure 138/78, pulse 75, height 6' (1.829 m), weight 228 lb 3.2 oz (103.5 kg).  General:  Well developed/well nourished in NAD Skin warm/dry Patient not depressed No peripheral clubbing Back-normal HEENT-normal/normal eyelids Neck supple/normal carotid upstroke bilaterally; no bruits; no JVD; no thyromegaly chest - CTA/ normal expansion CV - RRR/normal S1 and S2; no murmurs, rubs or gallops;  PMI nondisplaced Abdomen -NT/ND, no HSM, no mass, + bowel sounds, no bruit 2+ femoral pulses, no bruits Ext-no edema, chords, 2+ DP Neuro-grossly nonfocal  EKG Interpretation Date/Time:  Tuesday October 10 2022 08:38:40 EDT Ventricular Rate:  75 PR Interval:  142 QRS Duration:  88 QT Interval:  364 QTC Calculation: 406 R Axis:   48  Text Interpretation: Normal sinus rhythm Normal ECG No previous ECGs available Confirmed by Olga Millers ([id number]) on 10/10/2022 8:41:12  AM   A/P  1 dyspnea on exertion-etiology unclear.  His symptoms have progressed.  I will arrange an echocardiogram to assess LV function.  Will also arrange a coronary CTA to rule out obstructive coronary disease.  2 hypertension-blood pressure is borderline.  I have asked him to follow this and we will advance regimen as needed.  3 snoring-we will ask pulmonary to evaluate for potential sleep apnea.  Olga Millers, MD

## 2022-10-10 ENCOUNTER — Encounter: Payer: Self-pay | Admitting: Cardiology

## 2022-10-10 ENCOUNTER — Ambulatory Visit: Payer: Federal, State, Local not specified - PPO | Attending: Cardiology | Admitting: Cardiology

## 2022-10-10 VITALS — BP 138/78 | HR 75 | Ht 72.0 in | Wt 228.2 lb

## 2022-10-10 DIAGNOSIS — I10 Essential (primary) hypertension: Secondary | ICD-10-CM

## 2022-10-10 DIAGNOSIS — R0609 Other forms of dyspnea: Secondary | ICD-10-CM | POA: Diagnosis not present

## 2022-10-10 DIAGNOSIS — R0683 Snoring: Secondary | ICD-10-CM

## 2022-10-10 MED ORDER — METOPROLOL TARTRATE 100 MG PO TABS: ORAL_TABLET | ORAL | 0 refills | Status: DC

## 2022-10-10 NOTE — Patient Instructions (Signed)
Testing/Procedures:  Your physician has requested that you have an echocardiogram. Echocardiography is a painless test that uses sound waves to create images of your heart. It provides your doctor with information about the size and shape of your heart and how well your heart's chambers and valves are working. This procedure takes approximately one hour. There are no restrictions for this procedure. Please do NOT wear cologne, perfume, aftershave, or lotions (deodorant is allowed). Please arrive 15 minutes prior to your appointment time. [address]     Your cardiac CT will be scheduled at   [facility] [address] [place], [place] [address] [phone]    If scheduled at [facility], please arrive at the [facility] and Children's Entrance (Entrance C2) of [facility] 30 minutes prior to test start time. You can use the FREE valet parking offered at entrance C (encouraged to control the heart rate for the test)  Proceed to the [facility] Radiology Department (first floor) to check-in and test prep.  All radiology patients and guests should use entrance C2 at [facility], accessed from [facility], even though the hospital's physical address listed is [address].     Please follow these instructions carefully (unless otherwise directed):  An IV will be required for this test and Nitroglycerin will be given.  Hold all erectile dysfunction medications at least 3 days (72 hrs) prior to test. (Ie viagra, cialis, sildenafil, tadalafil, etc)   On the Night Before the Test: Be sure to Drink plenty of water. Do not consume any caffeinated/decaffeinated beverages or chocolate 12 hours prior to your test. Do not take any antihistamines 12 hours prior to your test.   On the Day of the Test: Drink plenty of water until 1 hour prior to the test. Do not eat any food 1 hour prior to test. You may take your  regular medications prior to the test.  Take metoprolol (Lopressor) 100 MG two hours prior to test        After the Test: Drink plenty of water. After receiving IV contrast, you may experience a mild flushed feeling. This is normal. On occasion, you may experience a mild rash up to 24 hours after the test. This is not dangerous. If this occurs, you can take Benadryl 25 mg and increase your fluid intake. If you experience trouble breathing, this can be serious. If it is severe call 911 IMMEDIATELY. If it is mild, please call our office.   We will call to schedule your test 2-4 weeks out understanding that some insurance companies will need an authorization prior to the service being performed.   For more information and frequently asked questions, please visit our website : [email or web address]  For non-scheduling related questions, please contact the cardiac imaging nurse navigator should you have any questions/concerns: Cardiac Imaging Nurse Navigators Direct Office Dial: [phone]   For scheduling needs, including cancellations and rescheduling, please call [place], [phone].    Follow-Up: At [facility], you and your health needs are our priority.  As part of our continuing mission to provide you with exceptional heart care, we have created designated Provider Care Teams.  These Care Teams include your primary Cardiologist (physician) and Advanced Practice Providers (APPs -  Physician Assistants and Nurse Practitioners) who all work together to provide you with the care you need, when you need it.  We recommend signing up for the patient portal called "MyChart".  Sign  up information is provided on this After Visit Summary.  MyChart is used to connect with patients for Virtual Visits (Telemedicine).  Patients are able to view lab/test results, encounter notes, upcoming appointments, etc.  Non-urgent messages can be sent to your provider as well.   To learn  more about what you can do with MyChart, go to [email or web address].    Your next appointment:   AS NEEDED

## 2022-10-18 ENCOUNTER — Encounter (HOSPITAL_COMMUNITY): Payer: Self-pay

## 2022-10-19 ENCOUNTER — Telehealth (HOSPITAL_COMMUNITY): Payer: Self-pay | Admitting: Emergency Medicine

## 2022-10-19 NOTE — Telephone Encounter (Signed)
Reaching out to patient to offer assistance regarding upcoming cardiac imaging study; pt verbalizes understanding of appt date/time, parking situation and where to check in, pre-test NPO status and medications ordered, and verified current allergies; name and call back number provided for further questions should they arise Cayne Yom RN Navigator Cardiac Imaging [facility] [phone] office [phone] cell 

## 2022-10-20 ENCOUNTER — Telehealth: Payer: Self-pay | Admitting: Cardiology

## 2022-10-20 ENCOUNTER — Ambulatory Visit (HOSPITAL_COMMUNITY)
Admission: RE | Admit: 2022-10-20 | Discharge: 2022-10-20 | Disposition: A | Discharge status: Home / Self Care | Payer: Federal, State, Local not specified - PPO | Source: Ambulatory Visit | Attending: Cardiology | Admitting: Cardiology

## 2022-10-20 DIAGNOSIS — R0609 Other forms of dyspnea: Secondary | ICD-10-CM | POA: Diagnosis not present

## 2022-10-20 DIAGNOSIS — I2510 Atherosclerotic heart disease of native coronary artery without angina pectoris: Secondary | ICD-10-CM

## 2022-10-20 MED ORDER — NITROGLYCERIN 0.4 MG SL SUBL
0.8000 mg | SUBLINGUAL_TABLET | SUBLINGUAL | Status: DC | PRN
  Administered 2022-10-20: 0.8 mg via SUBLINGUAL

## 2022-10-20 MED ORDER — IOHEXOL 350 MG/ML SOLN
100.0000 mL | Freq: Once | INTRAVENOUS | Status: AC | PRN
  Administered 2022-10-20: 100 mL via INTRAVENOUS

## 2022-10-20 MED ORDER — ROSUVASTATIN CALCIUM 40 MG PO TABS: 40.0000 mg | ORAL_TABLET | Freq: Every day | ORAL | 3 refills | Status: DC

## 2022-10-20 MED ORDER — NITROGLYCERIN 0.4 MG SL SUBL
SUBLINGUAL_TABLET | SUBLINGUAL | Status: AC
  Filled 2022-10-20: qty 2

## 2022-10-20 MED ORDER — ASPIRIN 81 MG PO TBEC: 81.0000 mg | DELAYED_RELEASE_TABLET | Freq: Every day | ORAL | Status: AC

## 2022-10-20 NOTE — Telephone Encounter (Signed)
Pt is returning nurse call regarding results and is requesting a callback. Please advise

## 2022-10-20 NOTE — Progress Notes (Signed)
Pt verbalized understanding of discharge instruction; opportunity for questions provided ?

## 2022-10-20 NOTE — Telephone Encounter (Addendum)
Lewayne Bunting, MD 10/20/2022 11:32 AM EDT     Mild CAD; would treat medically; add ASA 81 mg daily and crestor 40 mg daily; lipids and liver 8 weeks Estella Husk over the information above He said that he doesn't mind starting on medication; he really doesn't want to be on something life long. He said that he really does not eat that well and get enough activity. He will start this for now and reassess/revisit subject of "possible life-long medications?"  RX sent to pharmacy and lab slip mailed

## 2022-10-26 ENCOUNTER — Ambulatory Visit (HOSPITAL_COMMUNITY): Payer: Federal, State, Local not specified - PPO | Attending: Cardiology

## 2022-10-26 DIAGNOSIS — R0609 Other forms of dyspnea: Secondary | ICD-10-CM

## 2022-10-26 LAB — ECHOCARDIOGRAM COMPLETE
Area-P 1/2: 2.65 cm2
S' Lateral: 2.7 cm

## 2022-11-03 ENCOUNTER — Institutional Professional Consult (permissible substitution): Payer: Federal, State, Local not specified - PPO | Admitting: Internal Medicine

## 2022-12-18 ENCOUNTER — Encounter: Payer: Self-pay | Admitting: Internal Medicine

## 2022-12-18 ENCOUNTER — Ambulatory Visit: Payer: Federal, State, Local not specified - PPO | Admitting: Internal Medicine

## 2022-12-18 VITALS — BP 126/78 | HR 78 | Temp 98.0°F | Ht 72.0 in | Wt 233.4 lb

## 2022-12-18 DIAGNOSIS — R0602 Shortness of breath: Secondary | ICD-10-CM

## 2022-12-18 NOTE — Patient Instructions (Signed)
It was a pleasure to see you today!  Please schedule follow up scheduled with myself in 6-8 weeks.  If my schedule is not open yet, we will contact you with a reminder closer to that time. Please call [phone] if you haven't heard from [place] a month before, and always call us sooner if issues or concerns arise. You can also send [place] a message through MyChart, but but aware that this is not to be used for urgent issues and it may take up to 5-7 days to receive a reply. Please be aware that you will likely be able to view your results before I have a chance to respond to them. Please give [place] 5 business days to respond to any non-urgent results.    Before your next visit I would like you to have:  Full set of PFTs - 1 hour. Follow up with me can be virtual or in person.

## 2022-12-18 NOTE — Progress Notes (Signed)
Hector Phillips    [id number]    06/26/1960  Primary Care Physician:Wolters, Jasmine December, MD  Referring Physician: Mila Palmer, MD [address] [place],  [place] [address] Reason for Consultation: dyspnea on exertion Date of Consultation: 12/18/2022  Chief complaint:   Chief Complaint  Patient presents with   PULMONARY CONSULT    SOB with exertion since 2020.     HPI: Hector Phillips is a 62 y.o. man who presents for new patient evaluation for dyspnea.  Symptoms of dyspnea on exertion started since 2020 after his deployment in [place].  He was previously in [place] and [place]. Noticed towards the end of his time in [place] that he would be more fatigued than normal after working out. Can do all his ADLs without difficulty just needs to take more breaks than he's used to. Able to take his dogs for a walk.   He has seen a cardiologist and had CT coronary calcium score which was intermediate risk with mild CAD.   No chest tightness or wheezing. No recurrent pneumonia or bronchitis. Occasional dry cough.   No childhood asthma.   He has been more sedentary since 2020. He works at [employer] and is there during the week.   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 Lives at home with wife. 2 dogs.  Smoking history: never smoker, passive smoke exposure in childhood.   Social History   Occupational History    Employer: [employer]  Tobacco Use   Smoking status: Never   Smokeless tobacco: Never  Vaping Use   Vaping status: Never Used  Substance and Sexual Activity   Alcohol use: No   Drug use: Never   Sexual activity: Yes    Relevant family history:  Family History  Problem Relation Age of Onset   Cancer Father    Colon cancer Other    Lung disease Neg Hx     Past Medical History:  Diagnosis Date   Allergic rhinitis 01/29/2015   DOE (dyspnea on exertion)  12/15/2021   Essential hypertension 05/14/2017   GERD (gastroesophageal reflux disease) 05/06/2019   Sinusitis 01/12/2015    Past Surgical History:  Procedure Laterality Date   UMBILICAL HERNIA REPAIR       Physical Exam: Blood pressure 126/78, pulse 78, temperature 98 F (36.7 C), temperature source Oral, height 6' (1.829 m), weight 233 lb 6.4 oz (105.9 kg), SpO2 99%. Gen:      No acute distress ENT:  no nasal polyps, mucus membranes moist Lungs:    No increased respiratory effort, symmetric chest wall excursion, clear to auscultation bilaterally, no wheezes or crackles CV:         Regular rate and rhythm; no murmurs, rubs, or gallops.  No pedal edema Abd:      + bowel sounds; soft, non-tender; no distension MSK: no acute synovitis of DIP or PIP joints, no mechanics hands.  Skin:      Warm and dry; no rashes Neuro: normal speech, no focal facial asymmetry Psych: alert and oriented x3, normal mood and affect   Data Reviewed/Medical Decision Making:  Independent interpretation of tests: Imaging:  Review of patient's CT Coronary calcium October 2024 images revealed no pulmonary issues in visualized lung fields. The patient's images have been independently reviewed by me.    PFTs:    Labs: No results found for: "WBC", "HGB", "HCT", "  MCV", "PLT" No results found for: "NA", "K", "CL", "CO2"   Immunization status:  Immunization History  Administered Date(s) Administered   Moderna Sars-Covid-2 Vaccination 02/28/2019, 04/11/2019     I reviewed prior external note(s) from cardiology  I reviewed the result(s) of the labs and imaging as noted above.   I have ordered PFT  Assessment:  Shortness of breath  Plan/Recommendations:  Full set of PFTs - 1 hour. Follow up with me can be virtual or in person.    Return to Care: Return in about 4 weeks (around 01/15/2023) for next available PFT, follow up after.  Durel Salts, MD Pulmonary and Critical Care Medicine [facility] Office:[phone]  CC: Mila Palmer, MD

## 2023-01-15 ENCOUNTER — Ambulatory Visit (HOSPITAL_BASED_OUTPATIENT_CLINIC_OR_DEPARTMENT_OTHER): Payer: Federal, State, Local not specified - PPO | Admitting: Internal Medicine

## 2023-01-15 DIAGNOSIS — R0602 Shortness of breath: Secondary | ICD-10-CM | POA: Diagnosis not present

## 2023-01-15 LAB — PULMONARY FUNCTION TEST
DL/VA % pred: 95 %
DL/VA: 4 ml/min/mmHg/L
DLCO cor % pred: 88 %
DLCO cor: 25.87 ml/min/mmHg
DLCO unc % pred: 88 %
DLCO unc: 25.87 ml/min/mmHg
FEF 25-75 Post: 4.34 L/s
FEF 25-75 Pre: 3.62 L/s
FEF2575-%Change-Post: 19 %
FEF2575-%Pred-Post: 137 %
FEF2575-%Pred-Pre: 114 %
FEV1-%Change-Post: 4 %
FEV1-%Pred-Post: 97 %
FEV1-%Pred-Pre: 93 %
FEV1-Post: 3.76 L
FEV1-Pre: 3.61 L
FEV1FVC-%Change-Post: 4 %
FEV1FVC-%Pred-Pre: 106 %
FEV6-%Change-Post: 0 %
FEV6-%Pred-Post: 90 %
FEV6-%Pred-Pre: 90 %
FEV6-Post: 4.44 L
FEV6-Pre: 4.43 L
FEV6FVC-%Change-Post: 0 %
FEV6FVC-%Pred-Post: 104 %
FEV6FVC-%Pred-Pre: 104 %
FVC-%Change-Post: 0 %
FVC-%Pred-Post: 86 %
FVC-%Pred-Pre: 87 %
FVC-Post: 4.44 L
FVC-Pre: 4.46 L
Post FEV1/FVC ratio: 85 %
Post FEV6/FVC ratio: 100 %
Pre FEV1/FVC ratio: 81 %
Pre FEV6/FVC Ratio: 99 %
RV % pred: 84 %
RV: 1.98 L
TLC % pred: 90 %
TLC: 6.68 L

## 2023-01-15 NOTE — Patient Instructions (Signed)
Full PFT Performed Today  

## 2023-01-15 NOTE — Progress Notes (Signed)
Full PFT Performed Today  

## 2023-01-22 ENCOUNTER — Encounter: Payer: Self-pay | Admitting: *Deleted

## 2023-01-30 DIAGNOSIS — I2510 Atherosclerotic heart disease of native coronary artery without angina pectoris: Secondary | ICD-10-CM | POA: Diagnosis not present

## 2023-01-30 LAB — LIPID PANEL
Chol/HDL Ratio: 2.9 {ratio} (ref 0.0–5.0)
Cholesterol, Total: 135 mg/dL (ref 100–199)
HDL: 46 mg/dL (ref >39)
LDL Chol Calc (NIH): 68 mg/dL (ref 0–99)
Triglycerides: 113 mg/dL (ref 0–149)
VLDL Cholesterol Cal: 21 mg/dL (ref 5–40)

## 2023-01-30 LAB — HEPATIC FUNCTION PANEL
ALT: 26 [IU]/L (ref 0–44)
AST: 22 [IU]/L (ref 0–40)
Albumin: 4.7 g/dL (ref 3.8–4.9)
Alkaline Phosphatase: 119 [IU]/L (ref 44–121)
Bilirubin Total: 0.6 mg/dL (ref 0.0–1.2)
Bilirubin, Direct: 0.21 mg/dL (ref 0.00–0.40)
Total Protein: 7.1 g/dL (ref 6.0–8.5)

## 2023-02-21 DIAGNOSIS — Z79899 Other long term (current) drug therapy: Secondary | ICD-10-CM | POA: Diagnosis not present

## 2023-02-21 DIAGNOSIS — Z125 Encounter for screening for malignant neoplasm of prostate: Secondary | ICD-10-CM | POA: Diagnosis not present

## 2023-02-21 DIAGNOSIS — Z0000 Encounter for general adult medical examination without abnormal findings: Secondary | ICD-10-CM | POA: Diagnosis not present

## 2023-02-21 DIAGNOSIS — I10 Essential (primary) hypertension: Secondary | ICD-10-CM | POA: Diagnosis not present

## 2023-02-26 ENCOUNTER — Ambulatory Visit: Payer: Federal, State, Local not specified - PPO | Admitting: Internal Medicine

## 2023-02-26 ENCOUNTER — Encounter: Payer: Self-pay | Admitting: Internal Medicine

## 2023-02-26 VITALS — BP 134/76 | HR 67 | Resp 16 | Ht 72.0 in | Wt 231.0 lb

## 2023-02-26 DIAGNOSIS — R0609 Other forms of dyspnea: Secondary | ICD-10-CM | POA: Diagnosis not present

## 2023-02-26 NOTE — Patient Instructions (Signed)
 It was a pleasure to see you today!  Your lung function is normal. Your breathing testing, physical exam, and ct findings are reassuring. I agree with a regular exercise routine to help your endurance. Let me know if you want to get tested for sleep apnea. Otherwise come back and see me as needed.

## 2023-02-26 NOTE — Progress Notes (Signed)
 Hector Phillips    [id number]    1960-06-15  Primary Care Physician:Wolters, Genevia Kern, MD Date of Appointment: 02/26/2023 Established Patient Visit  Chief complaint:   Chief Complaint  Patient presents with   Shortness of Breath    Feels fine now short winded at times.      HPI: Hector Phillips is a 63 y.o. man, full time [employer], who presents for dyspnea.  Interval Updates: Here for follow up after Pfts which show normal pulmonary function. Did not have improvement with albuterol. Most of his symptoms are related to exertional dyspnea. Symptoms worse with hot weather. Symptoms started while he was overseas in [place] in 2020. He questions if he could be out of shape. 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   I have reviewed the patient's family social and past medical history and updated as appropriate.   Past Medical History:  Diagnosis Date   Allergic rhinitis 01/29/2015   DOE (dyspnea on exertion) 12/15/2021   Essential hypertension 05/14/2017   GERD (gastroesophageal reflux disease) 05/06/2019   Sinusitis 01/12/2015    Past Surgical History:  Procedure Laterality Date   UMBILICAL HERNIA REPAIR      Family History  Problem Relation Age of Onset   Cancer Father    Colon cancer Other    Lung disease Neg Hx     Social History   Occupational History    Employer: [employer]  Tobacco Use   Smoking status: Never   Smokeless tobacco: Never  Vaping Use   Vaping status: Never Used  Substance and Sexual Activity   Alcohol use: No   Drug use: Never   Sexual activity: Yes     Physical Exam: Blood pressure 134/76, pulse 67, resp. rate 16, height 6' (1.829 m), weight 231 lb (104.8 kg), SpO2 99%.  Gen:      No acute distress Lungs:    No increased respiratory effort, symmetric chest wall excursion, clear to auscultation bilaterally, no wheezes or crackles CV:          Regular rate and rhythm; no murmurs, rubs, or gallops.  No pedal edema   Data Reviewed: Imaging: I have personally reviewed the CT cardiac lung findings - lung windows visualized wnl  PFTs:     Latest Ref Rng & Units 01/15/2023    7:55 AM  PFT Results  FVC-Pre L 4.46   FVC-Predicted Pre % 87   FVC-Post L 4.44   FVC-Predicted Post % 86   Pre FEV1/FVC % % 81   Post FEV1/FCV % % 85   FEV1-Pre L 3.61   FEV1-Predicted Pre % 93   FEV1-Post L 3.76   DLCO uncorrected ml/min/mmHg 25.87   DLCO UNC% % 88   DLCO corrected ml/min/mmHg 25.87   DLCO COR %Predicted % 88   DLVA Predicted % 95   TLC L 6.68   TLC % Predicted % 90   RV % Predicted % 84    I have personally reviewed the patient's PFTs and normal pulmonary function.   Labs:  Immunization status: Immunization History  Administered Date(s) Administered   Anthrax 04/19/2004, 11/11/2010, 01/03/2011, 06/01/2011, 10/08/2017, 03/27/2018   Fluzone Influenza virus vaccine,trivalent (IIV3), split virus 10/07/2010, 11/20/2014   Hepatitis A, Adult 01/24/2003, 09/03/2003   Hepatitis B 11/01/1988   Hepatitis B, ADULT 04/19/2004, 06/05/2004, 02/19/2005   Influenza Inj Mdck Quad  Pf 12/29/2017, 11/10/2018   Influenza Nasal 09/27/2009   Influenza, Seasonal, Injecte, Preservative Fre 12/24/2011, 12/21/2012, 11/21/2013   Influenza,inj,Quad PF,6+ Mos 11/29/2007, 11/20/2015   Influenza,inj,quad, With Preservative 12/30/2016   MMR 10/23/1991   Moderna Sars-Covid-2 Vaccination 02/28/2019, 04/11/2019, 01/18/2020   OPV 11/01/1988   PPD Test 06/06/2003, 06/05/2004, 08/16/2004, 11/11/2010, 09/18/2011, 09/21/2011   Plague 09/02/1990, 12/17/1990, 10/23/1991   Rubella 11/01/1988   Smallpox 06/06/2003   Td 01/24/2003, 11/19/2020   Tdap 11/11/2010   Typhoid Inactivated 01/24/2003, 11/11/2010, 10/08/2017   Zoster Recombinant(Shingrix) 11/19/2020, 01/21/2021    External Records Personally Reviewed:   Assessment:  Dyspnea on exertion [place]  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 Your breathing testing, physical exam, and ct findings are reassuring. I agree with a regular exercise routine to help your endurance. Let me know if you want to get tested for sleep apnea. Otherwise come back and see me as needed.     Return to Care: Return if symptoms worsen or fail to improve.   Louie Rover, MD Pulmonary and Critical Care Medicine [facility] [phone]

## 2023-03-27 DIAGNOSIS — L821 Other seborrheic keratosis: Secondary | ICD-10-CM | POA: Diagnosis not present

## 2023-03-27 DIAGNOSIS — L570 Actinic keratosis: Secondary | ICD-10-CM | POA: Diagnosis not present

## 2023-03-27 DIAGNOSIS — L578 Other skin changes due to chronic exposure to nonionizing radiation: Secondary | ICD-10-CM | POA: Diagnosis not present

## 2023-03-27 DIAGNOSIS — L814 Other melanin hyperpigmentation: Secondary | ICD-10-CM | POA: Diagnosis not present

## 2023-03-27 DIAGNOSIS — D1801 Hemangioma of skin and subcutaneous tissue: Secondary | ICD-10-CM | POA: Diagnosis not present

## 2023-07-17 DIAGNOSIS — M545 Low back pain, unspecified: Secondary | ICD-10-CM | POA: Diagnosis not present

## 2023-07-17 DIAGNOSIS — L989 Disorder of the skin and subcutaneous tissue, unspecified: Secondary | ICD-10-CM | POA: Diagnosis not present

## 2023-07-17 DIAGNOSIS — R109 Unspecified abdominal pain: Secondary | ICD-10-CM | POA: Diagnosis not present

## 2023-08-01 DIAGNOSIS — L570 Actinic keratosis: Secondary | ICD-10-CM | POA: Diagnosis not present

## 2023-08-01 DIAGNOSIS — L821 Other seborrheic keratosis: Secondary | ICD-10-CM | POA: Diagnosis not present

## 2023-08-02 ENCOUNTER — Other Ambulatory Visit: Payer: Self-pay | Admitting: Surgery

## 2023-08-02 DIAGNOSIS — K439 Ventral hernia without obstruction or gangrene: Secondary | ICD-10-CM

## 2023-08-03 ENCOUNTER — Other Ambulatory Visit

## 2023-08-03 ENCOUNTER — Ambulatory Visit
Admission: RE | Admit: 2023-08-03 | Discharge: 2023-08-03 | Disposition: A | Discharge status: Home / Self Care | Source: Ambulatory Visit | Attending: Surgery | Admitting: Surgery

## 2023-08-03 DIAGNOSIS — K449 Diaphragmatic hernia without obstruction or gangrene: Secondary | ICD-10-CM | POA: Diagnosis not present

## 2023-08-03 DIAGNOSIS — K439 Ventral hernia without obstruction or gangrene: Secondary | ICD-10-CM

## 2023-08-03 DIAGNOSIS — N200 Calculus of kidney: Secondary | ICD-10-CM | POA: Diagnosis not present

## 2023-08-03 DIAGNOSIS — K5730 Diverticulosis of large intestine without perforation or abscess without bleeding: Secondary | ICD-10-CM | POA: Diagnosis not present

## 2023-08-03 DIAGNOSIS — K429 Umbilical hernia without obstruction or gangrene: Secondary | ICD-10-CM | POA: Diagnosis not present

## 2023-08-03 MED ORDER — IOPAMIDOL (ISOVUE-300) INJECTION 61%
100.0000 mL | Freq: Once | INTRAVENOUS | Status: AC | PRN
  Administered 2023-08-03: 100 mL via INTRAVENOUS

## 2023-08-07 DIAGNOSIS — M5459 Other low back pain: Secondary | ICD-10-CM | POA: Diagnosis not present

## 2023-08-10 DIAGNOSIS — M5459 Other low back pain: Secondary | ICD-10-CM | POA: Diagnosis not present

## 2023-08-14 DIAGNOSIS — M5459 Other low back pain: Secondary | ICD-10-CM | POA: Diagnosis not present

## 2023-08-28 DIAGNOSIS — M5459 Other low back pain: Secondary | ICD-10-CM | POA: Diagnosis not present

## 2023-09-10 DIAGNOSIS — M5459 Other low back pain: Secondary | ICD-10-CM | POA: Diagnosis not present

## 2023-10-06 ENCOUNTER — Other Ambulatory Visit: Payer: Self-pay | Admitting: Cardiology

## 2023-12-06 NOTE — Progress Notes (Signed)
 Sent message, via epic in basket, requesting orders in epic from Careers adviser.

## 2023-12-07 NOTE — Progress Notes (Signed)
 Sent message, via epic in basket, requesting orders in epic from Careers adviser.

## 2023-12-09 ENCOUNTER — Ambulatory Visit: Payer: Self-pay | Admitting: Surgery

## 2023-12-10 NOTE — Progress Notes (Addendum)
 Anesthesia Review:  PCP: whoever took Hector Phillips place per pt he does not know name  Cardiologist : none   PPM/ ICD: Device Orders: Rep Notified:  Chest x-ray : EKG : 12/17/2023  Echo : 10/17/2022  CT cors- 11/12/22  Stress test: 02/16/22  Cardiac Cath :   Activity level: can do a flight of stairs without difficutly  Sleep Study/ CPAP : none  Fasting Blood Sugar :      / Checks Blood Sugar -- times a day:    Blood Thinner/ Instructions /Last Dose: ASA / Instructions/ Last Dose :    81 mg aspirin 

## 2023-12-10 NOTE — Patient Instructions (Addendum)
 SURGICAL WAITING ROOM VISITATION  Patients having surgery or a procedure may have no more than 2 support people in the waiting area - these visitors may rotate.    Children under the age of 50 must have an adult with them who is not the patient.  Visitors with respiratory illnesses are discouraged from visiting and should remain at home.  If the patient needs to stay at the hospital during part of their recovery, the visitor guidelines for inpatient rooms apply. Pre-op nurse will coordinate an appropriate time for 1 support person to accompany patient in pre-op.  This support person may not rotate.    Please refer to the [facility] website for the visitor guidelines for Inpatients (after your surgery is over and you are in a regular room).       Your procedure is scheduled on:  12/28/2023    Report to [facility] Main Entrance    Report to admitting at  0515 AM   Call this number if you have problems the morning of surgery [phone]   Do not eat food  or drink liquids :After Midnight.                                                         If you have questions, please contact your surgeon's office.       Oral Hygiene is also important to reduce your risk of infection.                                    Remember - BRUSH YOUR TEETH THE MORNING OF SURGERY WITH YOUR REGULAR TOOTHPASTE  DENTURES WILL BE REMOVED PRIOR TO SURGERY PLEASE DO NOT APPLY Poly grip OR ADHESIVES!!!   Do NOT smoke after Midnight   Stop all vitamins and herbal supplements 7 days before surgery.   Take these medicines the morning of surgery with A SIP OF WATER:  omeprazole   DO NOT TAKE ANY ORAL DIABETIC MEDICATIONS DAY OF YOUR SURGERY  Bring CPAP mask and tubing day of surgery.                              You may not have any metal on your body including hair pins, jewelry, and body piercing             Do not wear make-up, lotions, powders, perfumes/cologne, or deodorant  Do not  wear nail polish including gel and S&S, artificial/acrylic nails, or any other type of covering on natural nails including finger and toenails. If you have artificial nails, gel coating, etc. that needs to be removed by a nail salon please have this removed prior to surgery or surgery may need to be canceled/ delayed if the surgeon/ anesthesia feels like they are unable to be safely monitored.   Do not shave  48 hours prior to surgery.               Men may shave face and neck.   Do not bring valuables to the hospital. [place] IS NOT             RESPONSIBLE   FOR VALUABLES.   Contacts, glasses, dentures or  bridgework may not be worn into surgery.   Bring small overnight bag day of surgery.   DO NOT BRING YOUR HOME MEDICATIONS TO THE HOSPITAL. PHARMACY WILL DISPENSE MEDICATIONS LISTED ON YOUR MEDICATION LIST TO YOU DURING YOUR ADMISSION IN THE HOSPITAL!    Patients discharged on the day of surgery will not be allowed to drive home.  Someone NEEDS to stay with you for the first 24 hours after anesthesia.   Special Instructions: Bring a copy of your healthcare power of attorney and living will documents the day of surgery if you haven't scanned them before.              Please read over the following fact sheets you were given: IF YOU HAVE QUESTIONS ABOUT YOUR PRE-OP INSTRUCTIONS PLEASE CALL [phone].   If you received a COVID test during your pre-op visit  it is requested that you wear a mask when out in public, stay away from anyone that may not be feeling well and notify your surgeon if you develop symptoms. If you test positive for Covid or have been in contact with anyone that has tested positive in the last 10 days please notify you surgeon.    [place] - Preparing for Surgery Before surgery, you can play an important role.  Because skin is not sterile, your skin needs to be as free of germs as possible.  You can reduce the number of germs on your skin by washing with CHG  (chlorahexidine gluconate) soap before surgery.  CHG is an antiseptic cleaner which kills germs and bonds with the skin to continue killing germs even after washing. Please DO NOT use if you have an allergy to CHG or antibacterial soaps.  If your skin becomes reddened/irritated stop using the CHG and inform your nurse when you arrive at Short Stay. Do not shave (including legs and underarms) for at least 48 hours prior to the first CHG shower.  You may shave your face/neck.  Please follow these instructions carefully:  1.  Shower with CHG Soap the night before surgery ONLY (DO NOT USE THE SOAP THE MORNING OF SURGERY).  2.  If you choose to wash your hair, wash your hair first as usual with your normal  shampoo.  3.  After you shampoo, rinse your hair and body thoroughly to remove the shampoo.                             4.  Use CHG as you would any other liquid soap.  You can apply chg directly to the skin and wash.  Gently with a scrungie or clean washcloth.  5.  Apply the CHG Soap to your body ONLY FROM THE NECK DOWN.   Do   not use on face/ open                           Wound or open sores. Avoid contact with eyes, ears mouth and   genitals (private parts).                       Wash face,  Genitals (private parts) with your normal soap.             6.  Wash thoroughly, paying special attention to the area where your    surgery  will be performed.  7.  Thoroughly rinse your body with  warm water from the neck down.  8.  DO NOT shower/wash with your normal soap after using and rinsing off the CHG Soap.                9.  Pat yourself dry with a clean towel.            10.  Wear clean pajamas.            11.  Place clean sheets on your bed the night of your first shower and do not  sleep with pets. Day of Surgery : Do not apply any CHG, lotions/deodorants the morning of surgery.  Please wear clean clothes to the hospital/surgery center.  FAILURE TO FOLLOW THESE INSTRUCTIONS MAY RESULT IN THE  CANCELLATION OF YOUR SURGERY  PATIENT SIGNATURE_________________________________  NURSE SIGNATURE__________________________________  ________________________________________________________________________

## 2023-12-17 ENCOUNTER — Encounter (HOSPITAL_COMMUNITY): Payer: Self-pay

## 2023-12-17 ENCOUNTER — Encounter (HOSPITAL_COMMUNITY)
Admission: RE | Admit: 2023-12-17 | Discharge: 2023-12-17 | Disposition: A | Source: Ambulatory Visit | Attending: Surgery

## 2023-12-17 ENCOUNTER — Other Ambulatory Visit: Payer: Self-pay

## 2023-12-17 VITALS — BP 132/92 | HR 67 | Temp 98.5°F | Resp 16 | Ht 72.0 in | Wt 218.0 lb

## 2023-12-17 DIAGNOSIS — Z01818 Encounter for other preprocedural examination: Secondary | ICD-10-CM | POA: Diagnosis not present

## 2023-12-17 HISTORY — DX: Unspecified osteoarthritis, unspecified site: M19.90

## 2023-12-17 LAB — BASIC METABOLIC PANEL WITH GFR
Anion gap: 8 (ref 5–15)
BUN: 19 mg/dL (ref 8–23)
CO2: 26 mmol/L (ref 22–32)
Calcium: 9.8 mg/dL (ref 8.9–10.3)
Chloride: 104 mmol/L (ref 98–111)
Creatinine, Ser: 1.08 mg/dL (ref 0.61–1.24)
GFR, Estimated: >60 mL/min (ref >60)
Glucose, Bld: 96 mg/dL (ref 70–99)
Potassium: 4.7 mmol/L (ref 3.5–5.1)
Sodium: 138 mmol/L (ref 135–145)

## 2023-12-17 LAB — CBC
HCT: 44.7 % (ref 39.0–52.0)
Hemoglobin: 14.3 g/dL (ref 13.0–17.0)
MCH: 30 pg (ref 26.0–34.0)
MCHC: 32 g/dL (ref 30.0–36.0)
MCV: 93.7 fL (ref 80.0–100.0)
Platelets: 245 K/uL (ref 150–400)
RBC: 4.77 MIL/uL (ref 4.22–5.81)
RDW: 12.8 % (ref 11.5–15.5)
WBC: 5.8 K/uL (ref 4.0–10.5)
nRBC: 0 % (ref 0.0–0.2)

## 2023-12-27 ENCOUNTER — Encounter (HOSPITAL_COMMUNITY): Payer: Self-pay | Admitting: Surgery

## 2023-12-27 NOTE — Anesthesia Preprocedure Evaluation (Signed)
 Anesthesia Evaluation  Patient identified by MRN, date of birth, ID band Patient awake    Reviewed: Allergy & Precautions, H&P , NPO status , Patient's Chart, lab work & pertinent test results  Airway Mallampati: II  TM Distance: >3 FB Neck ROM: Full    Dental no notable dental hx. (+) Teeth Intact, Dental Advisory Given   Pulmonary neg pulmonary ROS   Pulmonary exam normal breath sounds clear to auscultation       Cardiovascular Exercise Tolerance: Good hypertension, Pt. on medications + DOE  negative cardio ROS Normal cardiovascular exam Rhythm:Regular Rate:Normal     Neuro/Psych negative neurological ROS  negative psych ROS   GI/Hepatic negative GI ROS, Neg liver ROS,GERD  ,,  Endo/Other  negative endocrine ROS    Renal/GU negative Renal ROS  negative genitourinary   Musculoskeletal negative musculoskeletal ROS (+) Arthritis ,    Abdominal   Peds negative pediatric ROS (+)  Hematology negative hematology ROS (+)   Anesthesia Other Findings   Reproductive/Obstetrics negative OB ROS                              Anesthesia Physical Anesthesia Plan  ASA: 3  Anesthesia Plan: General   Post-op Pain Management: Minimal or no pain anticipated   Induction: Intravenous  PONV Risk Score and Plan: 2 and Ondansetron, Dexamethasone and Treatment may vary due to age or medical condition  Airway Management Planned: Oral ETT  Additional Equipment: None  Intra-op Plan:   Post-operative Plan: Extubation in OR  Informed Consent: I have reviewed the patients History and Physical, chart, labs and discussed the procedure including the risks, benefits and alternatives for the proposed anesthesia with the patient or authorized representative who has indicated his/her understanding and acceptance.       Plan Discussed with: Anesthesiologist and CRNA  Anesthesia Plan Comments: (  )          Anesthesia Quick Evaluation

## 2023-12-28 ENCOUNTER — Ambulatory Visit (HOSPITAL_COMMUNITY)
Admission: RE | Admit: 2023-12-28 | Discharge: 2023-12-28 | Disposition: A | Source: Ambulatory Visit | Attending: Surgery | Admitting: Surgery

## 2023-12-28 ENCOUNTER — Encounter (HOSPITAL_COMMUNITY): Payer: Self-pay | Admitting: Medical

## 2023-12-28 ENCOUNTER — Ambulatory Visit (HOSPITAL_COMMUNITY): Admitting: Certified Registered Nurse Anesthetist

## 2023-12-28 ENCOUNTER — Encounter (HOSPITAL_COMMUNITY): Admission: RE | Disposition: A | Payer: Self-pay | Source: Ambulatory Visit | Attending: Surgery

## 2023-12-28 ENCOUNTER — Encounter (HOSPITAL_COMMUNITY): Payer: Self-pay | Admitting: Surgery

## 2023-12-28 DIAGNOSIS — K436 Other and unspecified ventral hernia with obstruction, without gangrene: Secondary | ICD-10-CM | POA: Diagnosis not present

## 2023-12-28 DIAGNOSIS — I10 Essential (primary) hypertension: Secondary | ICD-10-CM | POA: Diagnosis not present

## 2023-12-28 DIAGNOSIS — Z01818 Encounter for other preprocedural examination: Secondary | ICD-10-CM

## 2023-12-28 DIAGNOSIS — K429 Umbilical hernia without obstruction or gangrene: Secondary | ICD-10-CM | POA: Insufficient documentation

## 2023-12-28 DIAGNOSIS — K219 Gastro-esophageal reflux disease without esophagitis: Secondary | ICD-10-CM | POA: Diagnosis not present

## 2023-12-28 DIAGNOSIS — K420 Umbilical hernia with obstruction, without gangrene: Secondary | ICD-10-CM | POA: Diagnosis not present

## 2023-12-28 DIAGNOSIS — Z79899 Other long term (current) drug therapy: Secondary | ICD-10-CM | POA: Diagnosis not present

## 2023-12-28 SURGERY — REPAIR, HERNIA, VENTRAL, LAPAROSCOPIC
Anesthesia: General | Site: Abdomen

## 2023-12-28 MED ORDER — CHLORHEXIDINE GLUCONATE 0.12 % MT SOLN
15.0000 mL | Freq: Once | OROMUCOSAL | Status: AC
  Administered 2023-12-28: 15 mL via OROMUCOSAL

## 2023-12-28 MED ORDER — BUPIVACAINE LIPOSOME 1.3 % IJ SUSP: 20.0000 mL | Freq: Once | INTRAMUSCULAR | Status: DC

## 2023-12-28 MED ORDER — LIDOCAINE HCL (CARDIAC) PF 100 MG/5ML IV SOSY
PREFILLED_SYRINGE | INTRAVENOUS | Status: DC | PRN
  Administered 2023-12-28: 50 mg via INTRAVENOUS

## 2023-12-28 MED ORDER — ROCURONIUM BROMIDE 100 MG/10ML IV SOLN
INTRAVENOUS | Status: DC | PRN
  Administered 2023-12-28: 60 mg via INTRAVENOUS

## 2023-12-28 MED ORDER — SUGAMMADEX SODIUM 200 MG/2ML IV SOLN
INTRAVENOUS | Status: AC
  Filled 2023-12-28: qty 2

## 2023-12-28 MED ORDER — ONDANSETRON HCL 4 MG/2ML IJ SOLN
INTRAMUSCULAR | Status: DC | PRN
  Administered 2023-12-28: 4 mg via INTRAVENOUS

## 2023-12-28 MED ORDER — CHLORHEXIDINE GLUCONATE 4 % EX SOLN: 60.0000 mL | Freq: Once | CUTANEOUS | Status: DC

## 2023-12-28 MED ORDER — BUPIVACAINE-EPINEPHRINE (PF) 0.25% -1:200000 IJ SOLN
INTRAMUSCULAR | Status: AC
  Filled 2023-12-28: qty 30

## 2023-12-28 MED ORDER — ACETAMINOPHEN 500 MG PO TABS: 1000.0000 mg | ORAL_TABLET | ORAL | Status: AC

## 2023-12-28 MED ORDER — LACTATED RINGERS IV SOLN: INTRAVENOUS | Status: DC | PRN

## 2023-12-28 MED ORDER — PHENYLEPHRINE 80 MCG/ML (10ML) SYRINGE FOR IV PUSH (FOR BLOOD PRESSURE SUPPORT)
PREFILLED_SYRINGE | INTRAVENOUS | Status: AC
  Filled 2023-12-28: qty 10

## 2023-12-28 MED ORDER — OXYCODONE HCL 5 MG PO TABS
5.0000 mg | ORAL_TABLET | Freq: Once | ORAL | Status: AC | PRN
  Administered 2023-12-28: 5 mg via ORAL

## 2023-12-28 MED ORDER — FENTANYL CITRATE (PF) 100 MCG/2ML IJ SOLN
INTRAMUSCULAR | Status: DC | PRN
  Administered 2023-12-28 (×3): 50 ug via INTRAVENOUS

## 2023-12-28 MED ORDER — FENTANYL CITRATE (PF) 50 MCG/ML IJ SOSY
25.0000 ug | PREFILLED_SYRINGE | INTRAMUSCULAR | Status: DC | PRN
  Administered 2023-12-28 (×2): 25 ug via INTRAVENOUS

## 2023-12-28 MED ORDER — ACETAMINOPHEN 500 MG PO TABS
ORAL_TABLET | ORAL | Status: AC
  Administered 2023-12-28: 1000 mg via ORAL
  Filled 2023-12-28: qty 2

## 2023-12-28 MED ORDER — CEFAZOLIN SODIUM-DEXTROSE 2-4 GM/100ML-% IV SOLN
INTRAVENOUS | Status: AC
  Filled 2023-12-28: qty 100

## 2023-12-28 MED ORDER — METHOCARBAMOL 500 MG PO TABS: 500.0000 mg | ORAL_TABLET | Freq: Three times a day (TID) | ORAL | 0 refills | Status: AC | PRN

## 2023-12-28 MED ORDER — ROCURONIUM BROMIDE 10 MG/ML (PF) SYRINGE
PREFILLED_SYRINGE | INTRAVENOUS | Status: AC
  Filled 2023-12-28: qty 10

## 2023-12-28 MED ORDER — ONDANSETRON HCL 4 MG/2ML IJ SOLN: 4.0000 mg | Freq: Once | INTRAMUSCULAR | Status: DC | PRN

## 2023-12-28 MED ORDER — OXYCODONE HCL 5 MG PO TABS
ORAL_TABLET | ORAL | Status: AC
  Filled 2023-12-28: qty 1

## 2023-12-28 MED ORDER — MIDAZOLAM HCL 5 MG/5ML IJ SOLN
INTRAMUSCULAR | Status: DC | PRN
  Administered 2023-12-28: 2 mg via INTRAVENOUS

## 2023-12-28 MED ORDER — OXYCODONE HCL 5 MG/5ML PO SOLN: 5.0000 mg | Freq: Once | ORAL | Status: AC | PRN

## 2023-12-28 MED ORDER — GABAPENTIN 300 MG PO CAPS
ORAL_CAPSULE | ORAL | Status: AC
  Administered 2023-12-28: 300 mg via ORAL
  Filled 2023-12-28: qty 1

## 2023-12-28 MED ORDER — BUPIVACAINE-EPINEPHRINE 0.25% -1:200000 IJ SOLN
INTRAMUSCULAR | Status: DC | PRN
  Administered 2023-12-28: 30 mL

## 2023-12-28 MED ORDER — SUGAMMADEX SODIUM 200 MG/2ML IV SOLN
INTRAVENOUS | Status: DC | PRN
  Administered 2023-12-28: 200 mg via INTRAVENOUS

## 2023-12-28 MED ORDER — LIDOCAINE HCL (PF) 2 % IJ SOLN
INTRAMUSCULAR | Status: AC
  Filled 2023-12-28: qty 5

## 2023-12-28 MED ORDER — FENTANYL CITRATE (PF) 100 MCG/2ML IJ SOLN
INTRAMUSCULAR | Status: AC
  Filled 2023-12-28: qty 2

## 2023-12-28 MED ORDER — GABAPENTIN 300 MG PO CAPS: 300.0000 mg | ORAL_CAPSULE | ORAL | Status: AC

## 2023-12-28 MED ORDER — MEPERIDINE HCL 25 MG/ML IJ SOLN: 6.2500 mg | INTRAMUSCULAR | Status: DC | PRN

## 2023-12-28 MED ORDER — FENTANYL CITRATE (PF) 50 MCG/ML IJ SOSY
PREFILLED_SYRINGE | INTRAMUSCULAR | Status: AC
  Filled 2023-12-28: qty 2

## 2023-12-28 MED ORDER — LACTATED RINGERS IV SOLN: INTRAVENOUS | Status: DC

## 2023-12-28 MED ORDER — ORAL CARE MOUTH RINSE: 15.0000 mL | Freq: Once | OROMUCOSAL | Status: AC

## 2023-12-28 MED ORDER — PROPOFOL 10 MG/ML IV BOLUS
INTRAVENOUS | Status: DC | PRN
  Administered 2023-12-28: 180 mg via INTRAVENOUS

## 2023-12-28 MED ORDER — PHENYLEPHRINE HCL (PRESSORS) 10 MG/ML IV SOLN
INTRAVENOUS | Status: DC | PRN
  Administered 2023-12-28 (×6): 80 ug via INTRAVENOUS

## 2023-12-28 MED ORDER — OXYCODONE HCL 5 MG PO TABS: 5.0000 mg | ORAL_TABLET | Freq: Three times a day (TID) | ORAL | 0 refills | Status: AC | PRN

## 2023-12-28 MED ORDER — DOCUSATE SODIUM 100 MG PO CAPS: 100.0000 mg | ORAL_CAPSULE | Freq: Two times a day (BID) | ORAL | 0 refills | Status: AC

## 2023-12-28 MED ORDER — CEFAZOLIN SODIUM-DEXTROSE 2-4 GM/100ML-% IV SOLN: 2.0000 g | INTRAVENOUS | Status: DC

## 2023-12-28 MED ADMIN — Sodium Chloride Irrigation Soln 0.9%: 1000 mL | NDC 99999050048

## 2023-12-28 MED FILL — Propofol IV Emul 200 MG/20ML (10 MG/ML): INTRAVENOUS | Qty: 20 | Status: AC

## 2023-12-28 MED FILL — Midazolam HCl Inj 2 MG/2ML (Base Equivalent): INTRAMUSCULAR | Qty: 2 | Status: AC

## 2023-12-28 MED FILL — Ondansetron HCl Inj 4 MG/2ML (2 MG/ML): INTRAMUSCULAR | Qty: 2 | Status: AC

## 2023-12-28 SURGICAL SUPPLY — 41 items
BAG COUNTER SPONGE SURGICOUNT (BAG) IMPLANT
BENZOIN TINCTURE PRP APPL 2/3 (GAUZE/BANDAGES/DRESSINGS) IMPLANT
BINDER ABDOMINAL 12 ML 46-62 (SOFTGOODS) IMPLANT
CHLORAPREP W/TINT 26 (MISCELLANEOUS) ×1 IMPLANT
COVER SURGICAL LIGHT HANDLE (MISCELLANEOUS) ×1 IMPLANT
DERMABOND ADVANCED .7 DNX12 (GAUZE/BANDAGES/DRESSINGS) IMPLANT
DEVICE SECURE STRAP 25 ABSORB (INSTRUMENTS) IMPLANT
DRSG TEGADERM 2-3/8X2-3/4 SM (GAUZE/BANDAGES/DRESSINGS) IMPLANT
DRSG TEGADERM 4X4.75 (GAUZE/BANDAGES/DRESSINGS) IMPLANT
ELECT PENCIL ROCKER SW 15FT (MISCELLANEOUS) IMPLANT
ELECT REM PT RETURN 15FT ADLT (MISCELLANEOUS) ×1 IMPLANT
GAUZE SPONGE 2X2 8PLY STRL LF (GAUZE/BANDAGES/DRESSINGS) IMPLANT
GAUZE SPONGE 4X4 12PLY STRL (GAUZE/BANDAGES/DRESSINGS) IMPLANT
GLOVE BIO SURGEON STRL SZ 6 (GLOVE) ×1 IMPLANT
GLOVE INDICATOR 6.5 STRL GRN (GLOVE) ×1 IMPLANT
GLOVE SS BIOGEL STRL SZ 6 (GLOVE) ×1 IMPLANT
GOWN STRL REUS W/ TWL LRG LVL3 (GOWN DISPOSABLE) ×1 IMPLANT
GOWN STRL REUS W/ TWL XL LVL3 (GOWN DISPOSABLE) IMPLANT
GRASPER SUT TROCAR 14GX15 (MISCELLANEOUS) ×1 IMPLANT
IRRIGATION SUCT STRKRFLW 2 WTP (MISCELLANEOUS) IMPLANT
KIT BASIN OR (CUSTOM PROCEDURE TRAY) ×1 IMPLANT
KIT TURNOVER KIT A (KITS) ×1 IMPLANT
MARKER SKIN DUAL TIP RULER LAB (MISCELLANEOUS) ×1 IMPLANT
MESH VENTRLGHT ELLIPSE 8X6XMFL (Mesh Specialty) IMPLANT
NDL HYPO 22X1.5 SAFETY MO (MISCELLANEOUS) IMPLANT
NEEDLE HYPO 22X1.5 SAFETY MO (MISCELLANEOUS) IMPLANT
SCISSORS LAP 5X35 DISP (ENDOMECHANICALS) ×1 IMPLANT
SET TUBE SMOKE EVAC HIGH FLOW (TUBING) ×1 IMPLANT
SHEARS HARMONIC 36 ACE (MISCELLANEOUS) IMPLANT
SLEEVE Z-THREAD 5X100MM (TROCAR) ×1 IMPLANT
SPIKE FLUID TRANSFER (MISCELLANEOUS) IMPLANT
STRIP CLOSURE SKIN 1/2X4 (GAUZE/BANDAGES/DRESSINGS) ×1 IMPLANT
SUT ETHIBOND 0 (SUTURE) IMPLANT
SUT ETHIBOND 0 MO6 C/R (SUTURE) IMPLANT
SUT MNCRL AB 4-0 PS2 18 (SUTURE) ×1 IMPLANT
SUT NOVA 0 T19/GS 22DT (SUTURE) IMPLANT
SUT VIC AB 3-0 SH 27X BRD (SUTURE) IMPLANT
TOWEL OR 17X26 10 PK STRL BLUE (TOWEL DISPOSABLE) ×1 IMPLANT
TRAY LAPAROSCOPIC (CUSTOM PROCEDURE TRAY) ×1 IMPLANT
TROCAR ADV FIXATION 12X100MM (TROCAR) ×1 IMPLANT
TROCAR Z-THREAD OPTICAL 5X100M (TROCAR) ×1 IMPLANT

## 2023-12-28 NOTE — H&P (Signed)
 Hector Phillips [id number]    Referring Provider:  Verena Reena LABOR, MD     Subjective    Chief Complaint: New Consultation (abd hernia w/o obst or gang, pain. )       History of Present Illness:    Very 63 year old male with history of GERD, hypertension, back pain, arthritis, and history of umbilical hernia repair by Dr. Lily in 2018 (notes unavailable; patient does think mesh was used) who presents with right sided abdominal pain in the region superior and to the right of the umbilicus.  Has noted swelling/asymmetry in this area along with intermittent very quick and transient sharp pain most prominent when he coughs or sneezes.  Symptoms have become more noticeable over time.   He is recently retired from [employer].  Does not do any significant heavy lifting.     Review of Systems: A complete review of systems was obtained from the patient.  I have reviewed this information and discussed as appropriate with the patient.  See HPI as well for other ROS.     Medical History:     Past Medical History:  Diagnosis Date   GERD (gastroesophageal reflux disease)     Hyperlipidemia     Hypertension        There is no problem list on file for this patient.          Past Surgical History:  Procedure Laterality Date   HERNIA REPAIR          No Known Allergies         Current Outpatient Medications on File Prior to Visit  Medication Sig Dispense Refill   lisinopriL (ZESTRIL) 10 MG tablet Take 10 mg by mouth once daily       omeprazole (PRILOSEC) 20 MG DR capsule TAKE 1 CAPSULE BY MOUTH EVERY DAY 30 MINUTES BEFORE MORNING MEAL FOR 90 DAYS       rosuvastatin  (CRESTOR ) 40 MG tablet Take 40 mg by mouth once daily       aspirin  81 MG EC tablet Take 81 mg by mouth once daily (Patient not taking: Reported on 08/02/2023)        No current facility-administered medications on file prior to visit.           Family History  Problem Relation Age of Onset   Colon  cancer Father        Social History       Tobacco Use  Smoking Status Never  Smokeless Tobacco Never      Social History        Socioeconomic History   Marital status: Married  Tobacco Use   Smoking status: Never   Smokeless tobacco: Never  Vaping Use   Vaping status: Never Used  Substance and Sexual Activity   Alcohol use: Never   Drug use: Never    Social Drivers of Health        Housing Stability: Unknown (08/02/2023)    Housing Stability Vital Sign     Homeless in the Last Year: No      Objective:          Vitals:    08/02/23 0931 08/02/23 0932  BP: 108/80    Pulse: 91    Temp: 36.7 C (98 F)    SpO2: 93%    Weight: 100.5 kg (221 lb 9.6 oz)    Height: 182.9 cm (6')    PainSc:   0-No pain  PainLoc:   Abdomen    Body mass index is 30.05 kg/m.   Gen: A&Ox3, no distress  Chest: respiratory effort is normal. Abdomen: soft, nondistended, there is at least a diastases in the periumbilical region with vasalva but there is no overt protrusion or palpable fascial defect.  The umbilicus inside to the right and superior of this are tender to palpation without palpable mass. Neuro: no gross deficit Psych: appropriate mood and affect, normal insight/judgment intact  Skin: warm and dry     Assessment and Plan:  Diagnoses and all orders for this visit:   Ventral hernia without obstruction or gangrene -     CT abdomen pelvis with contrast     Suspected hernia recurrence versus scar related pain versus diastases.  Unclear on exam; will proceed with CT scan to further evaluate prior to considering surgical intervention.     Addendum 08/03/23 14:41: Patient CT scan has been done and read.  I have reviewed the images and reviewed the report and discussed the findings with the patient.  I recommend a laparoscopic repair with mesh and I reviewed the procedure with him as well as risks of surgery including bleeding, infection, pain which may be chronic, scarring,  injury to intra-abdominal structures, hematoma/seroma, conversion to open surgery, hernia recurrence, general cardiovascular, pulmonary, and thromboembolic risks.  Reviewed typical postoperative course and recovery timeline/activity limitations.  Questions were welcomed and answered to his satisfaction.  He would like to hold off on surgery until the wintertime which at this point I think is reasonable given this is minimally symptomatic.  The umbilical defect is quite broad and we discussed it is very unlikely that he would develop bowel incarceration or strangulation but I advised him as to symptoms that should prompt him to seek emergency treatment.  He expressed understanding.  We will contact him in the fall to potentially schedule surgery and he knows to call us  if there are any changes in his situation last symptoms between now and then.   Mitzie Freund MD FACS

## 2023-12-28 NOTE — Anesthesia Procedure Notes (Signed)
 Procedure Name: Intubation Date/Time: 12/28/2023 7:32 AM  Performed by: Buster Catheryn SAUNDERS, CRNAPre-anesthesia Checklist: Patient identified, Emergency Drugs available, Suction available and Patient being monitored Patient Re-evaluated:Patient Re-evaluated prior to induction Oxygen Delivery Method: Circle system utilized Preoxygenation: Pre-oxygenation with 100% oxygen Induction Type: IV induction Ventilation: Mask ventilation without difficulty Laryngoscope Size: Miller and 2 Grade View: Grade II Tube type: Oral Tube size: 7.0 mm Number of attempts: 1 Airway Equipment and Method: Stylet and Oral airway Placement Confirmation: ETT inserted through vocal cords under direct vision, positive ETCO2 and breath sounds checked- equal and bilateral Secured at: 22 cm Tube secured with: Tape Dental Injury: Teeth and Oropharynx as per pre-operative assessment

## 2023-12-28 NOTE — Op Note (Signed)
 Operative Note  Hector Phillips  [id number]  [id number]  12/28/2023   Surgeon: Mitzie Freund MD FACS   Procedure performed: Laparoscopic repair of recurrent umbilical and incarcerated epigastric hernias using intraperitoneal onlay mesh, total hernia dimensions 10.5 cm x 4cm   Preop diagnosis: Recurrent umbilical hernia, incarcerated epigastric hernia, see dimensions above Post-op diagnosis/intraop findings: Same, Swiss cheese type with at least 3 fascial defects spanning from the periumbilical region to the inferior aspect of the falciform, see dimensions above   Specimens: no Retained items: no  EBL: minimal cc Complications: none   Description of procedure: After obtaining informed consent the patient was taken to the operating room and placed supine on operating room table where general endotracheal anesthesia was initiated, preoperative antibiotics were administered, SCDs applied, and a formal timeout was performed.  The abdomen was prepped and draped in usual sterile fashion.  Peritoneal access was gained using optical entry in the left upper quadrant followed by insufflation to 15 mmHg.  The abdominal cavity was inspected and confirmed to be free of injury from our entry.  Under direct visualization an additional left-sided 5 mm and 12 mm trocar were placed after infiltration with local.  A combination of the harmonic scalpel and gentle traction were used to dissect omental adhesions off the anterior abdominal wall and from the hernia defects.  The chronically incarcerated omentum was able to be reduced from the most cephalad epigastric hernia defect as well as from the recurrent umbilical hernia defect.  We were able to identify at least 3 fascial defects all relatively small but close together.  Total hernia dimensions were measured at 10.5 cm cephalocaudad by 4 cm transverse.  The recurrent umbilical defect was the largest, we attempted to excise the prior mesh and hernia sac year and  were able to partially do this but much of the prior mesh was left in situ.  The falciform ligament and median umbilical ligament were taken down several centimeters to create a landing zone for the mesh using the harmonic scalpel.  Elected not to close the fascial defects given small fascial bridges between them.  A 20 cm Ventralight mesh was selected and brought onto the field.  Stay sutures of 0 Ethibond were placed in the 4 cardinal directions and the mesh was marked for orientation.  This was then inserted through our 12 mm trocar and unfurled.  We confirmed that the rough/uncoated surface of the mesh opposed to the abdominal wall and the smooth/coated surface faced to the viscera.  The stay sutures were then brought through the abdominal wall for transfascial fixation and the 4 cardinal directions using a PMI device under direct visualization.  The secure strap tacker was then used to further affix the mesh to the abdominal wall with an outer and inner crown of tacks.  On completion, the mesh is smoothly flush against the anterior abdominal wall with no exposed rough surface.  Omentum was brought down over the viscera and the median umbilical ligament was tacked back up over the inferior aspect of the mesh.  The 12 mm trocar site was closed with 0 Ethibond in the fascia again using the PMI device.  The abdomen was then desufflated and all trocars removed.  Skin incisions were closed with subcuticular 4-0 Monocryl followed by benzoin, Steri-Strips, and sterile dressings.  The patient was then awakened, extubated and taken to PACU in stable condition.    All counts were correct at the completion of the case.

## 2023-12-28 NOTE — Transfer of Care (Signed)
 Immediate Anesthesia Transfer of Care Note  Patient: Hector Phillips  Procedure(s) Performed: REPAIR, HERNIA, VENTRAL, LAPAROSCOPIC (Abdomen)  Patient Location: PACU  Anesthesia Type:General  Level of Consciousness: awake, alert , and oriented  Airway & Oxygen Therapy: Patient Spontanous Breathing and Patient connected to face mask oxygen  Post-op Assessment: Report given to RN and Post -op Vital signs reviewed and stable  Post vital signs: Reviewed and stable  Last Vitals:  Vitals Value Taken Time  BP 130/94 12/28/23 09:15  Temp    Pulse 71 12/28/23 09:16  Resp 16 12/28/23 09:11  SpO2 100 % 12/28/23 09:16  Vitals shown include unfiled device data.  Last Pain:  Vitals:   12/28/23 0531  TempSrc: Oral  PainSc: 0-No pain         Complications: No notable events documented.

## 2023-12-28 NOTE — Anesthesia Postprocedure Evaluation (Signed)
 Anesthesia Post Note  Patient: Hector Phillips  Procedure(s) Performed: REPAIR, HERNIA, VENTRAL, LAPAROSCOPIC (Abdomen)     Patient location during evaluation: PACU Anesthesia Type: General Level of consciousness: awake and alert Pain management: pain level controlled Vital Signs Assessment: post-procedure vital signs reviewed and stable Respiratory status: spontaneous breathing, nonlabored ventilation, respiratory function stable and patient connected to nasal cannula oxygen Cardiovascular status: blood pressure returned to baseline and stable Postop Assessment: no apparent nausea or vomiting Anesthetic complications: no   No notable events documented.  Last Vitals:  Vitals:   12/28/23 0915 12/28/23 0930  BP: (!) 130/94 (!) 129/98  Pulse: 76 64  Resp: 16 20  Temp: (!) 36.4 C   SpO2: 100% 94%    Last Pain:  Vitals:   12/28/23 0932  TempSrc:   PainSc: 6                  Lonny Eisen

## 2023-12-28 NOTE — Discharge Instructions (Addendum)
 [facility] REPAIR: POST OP INSTRUCTIONS   EAT Gradually transition to your usual diet over the next few days after discharge.  WALK Walk an hour a day (cumulative- not all at once).  Control your pain to do that.    CONTROL PAIN Control pain so that you can walk, sleep, tolerate sneezing/coughing, and go up/down stairs.  HAVE A BOWEL MOVEMENT DAILY Keep your bowels regular to avoid problems.  OK to try a laxative to override constipation.  OK to use an antidiarrheal to slow down diarrhea.  Call if not better after 2 tries  CALL IF YOU HAVE PROBLEMS/CONCERNS Call if you are still struggling despite following these instructions. Call if you have concerns not answered by these instructions  ######################################################################    DIET: Follow a light bland diet & liquids the first 24 hours after arrival home, such as soup, liquids, starches, etc.  Be sure to drink plenty of fluids.  Quickly advance to a usual solid diet within a few days.  Avoid fast food or heavy meals initially as you are more likely to get nauseated or have irregular bowels.  A low-sugar, high-fiber diet for the rest of your life is ideal.   Take your usually prescribed home medications unless otherwise directed.  PAIN CONTROL: Pain is best controlled by a usual combination of three different methods TOGETHER: Ice/Heat Over the counter pain medication Prescription pain medication Most patients will experience some swelling and bruising around the hernia(s) such as the bellybutton, groins, or old incisions.  Ice packs or heating pads (30-60 minutes up to 6 times a day) will help. Use ice for the first few days to help decrease swelling and bruising, then switch to heat to help relax tight/sore spots and speed recovery.  Some people prefer to use ice alone, heat alone, alternating between ice & heat.  Experiment to what works for you.  Swelling and bruising can take several weeks to  resolve.   It is helpful to take an over-the-counter pain medication regularly for the first few days: Naproxen (Aleve, etc)  Two 220mg  tabs twice a day OR Ibuprofen (Advil, etc) Three 200mg  tabs four times a day (every meal & bedtime) AND Acetaminophen (Tylenol, etc) 325-650mg  four times a day (every meal & bedtime) A  prescription for pain medication should be given to you upon discharge.  Take your pain medication as prescribed, IF NEEDED.  If you are having problems/concerns with the prescription medicine (does not control pain, nausea, vomiting, rash, itching, etc), please call us  [phone]) [phone] to see if we need to switch you to a different pain medicine that will work better for you and/or control your side effect better. If you need a refill on your pain medication, please contact your pharmacy.  They will contact our office to request authorization. Prescriptions will not be filled after 5 pm or on week-ends.  Avoid getting constipated.  Between the surgery and the pain medications, it is common to experience some constipation.  Increasing fluid intake and taking a fiber supplement (such as Metamucil, Citrucel, FiberCon, MiraLax, etc) 1-2 times a day regularly will usually help prevent this problem from occurring.  A mild laxative (prune juice, Milk of Magnesia, MiraLax, etc) should be taken according to package directions if there are no bowel movements after 48 hours.    Wash / shower every day, starting 2 days after surgery.  You may shower over the steri strips which are waterproof.  No rubbing, scrubbing, lotions or ointments to incision(s).  Do not soak or submerge incisions.   Remove your outer bandages 2 days after surgery. Steri strips (small white tapes directly on incisions) will peel off after 1-2 weeks.  You may leave the incisions open to air.  You may replace a dressing/Band-Aid to cover an incision for comfort if you wish.  Continue to shower over incision(s) after the dressing is  off.  ACTIVITIES as tolerated:  Wear your abdominal binder at all times for the first week after surgery. Following this, wear it when you are up and moving around to help support the abdominal wall and reduce discomfort. You may resume regular (light) daily activities beginning the next day--such as daily self-care, walking, climbing stairs--gradually increasing activities as tolerated.  Control your pain so that you can walk an hour a day.  If you can walk 30 minutes without difficulty, it is safe to try more intense activity such as jogging, treadmill, bicycling, low-impact aerobics, swimming, etc. Refrain from the most intensive and strenuous activity such as sit-ups, heavy lifting, contact sports, etc  Refrain from any heavy lifting or straining until at least 6 weeks after surgery.   DO NOT PUSH THROUGH PAIN.  Let pain be your guide: If it hurts to do something, don't do it.  Pain is your body warning you to avoid that activity for another week until the pain goes down. You may drive when you are no longer taking prescription pain medication, you can comfortably wear a seatbelt, and you can safely maneuver your car and apply brakes. You may have sexual intercourse when it is comfortable.   FOLLOW UP in our office Please call [facility] at [phone] to set up an appointment to see your surgeon in the office for a follow-up appointment approximately 2-3 weeks after your surgery. Make sure that you call for this appointment the day you arrive home to insure a convenient appointment time.  9.  If you have disability of FMLA / Family leave forms, please bring the forms to the office for processing.  (do not give to your surgeon).  WHEN TO CALL US  ([phone]) [phone]: Poor pain control Reactions / problems with new medications (rash/itching, nausea, etc)  Fever over 101.5 F (38.5 C) Inability to urinate Nausea and/or vomiting Worsening swelling or bruising Continued bleeding from incision. Increased  pain, redness, or drainage from the incision   The clinic staff is available to answer your questions during regular business hours (8:30am-5pm).  Please dont hesitate to call and ask to speak to one of our nurses for clinical concerns.   If you have a medical emergency, go to the nearest emergency room or call 911.  A surgeon from [facility] is always on call at the hospitals in [facility], [place] [address], [address], [place], [place]  [address] ?  [address] [address], [place], [place]   [address] MAIN: [phone] ? TOLL FREE: [phone] ? FAX: [phone] [email or web address]

## 2023-12-31 ENCOUNTER — Encounter (HOSPITAL_COMMUNITY): Payer: Self-pay | Admitting: Surgery

## 2024-01-05 ENCOUNTER — Other Ambulatory Visit: Payer: Self-pay | Admitting: Cardiology

## 2024-02-08 ENCOUNTER — Other Ambulatory Visit: Payer: Self-pay | Admitting: Cardiology
